# Patient Record
Sex: Female | Born: 1985 | Race: Black or African American | Hispanic: No | Marital: Married | Smoking: Never smoker
Health system: Southern US, Community
[De-identification: ages and names within clinical notes are randomized; demographics above are authoritative.]

## PROBLEM LIST (undated history)

## (undated) ENCOUNTER — Inpatient Hospital Stay (HOSPITAL_COMMUNITY): Payer: Self-pay

## (undated) ENCOUNTER — Ambulatory Visit: Payer: 59 | Source: Home / Self Care

## (undated) DIAGNOSIS — I471 Supraventricular tachycardia, unspecified: Secondary | ICD-10-CM

## (undated) DIAGNOSIS — IMO0002 Reserved for concepts with insufficient information to code with codable children: Secondary | ICD-10-CM

## (undated) DIAGNOSIS — D219 Benign neoplasm of connective and other soft tissue, unspecified: Secondary | ICD-10-CM

## (undated) DIAGNOSIS — R51 Headache: Secondary | ICD-10-CM

## (undated) HISTORY — DX: Headache: R51

## (undated) HISTORY — PX: HYSTEROSCOPY: SHX211

## (undated) HISTORY — PX: NO PAST SURGERIES: SHX2092

## (undated) HISTORY — DX: Reserved for concepts with insufficient information to code with codable children: IMO0002

## (undated) HISTORY — DX: Benign neoplasm of connective and other soft tissue, unspecified: D21.9

---
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
## 2003-12-04 ENCOUNTER — Emergency Department (HOSPITAL_COMMUNITY): Admission: EM | Admit: 2003-12-04 | Discharge: 2003-12-04 | Payer: Self-pay | Admitting: Emergency Medicine

## 2003-12-06 ENCOUNTER — Emergency Department (HOSPITAL_COMMUNITY): Admission: EM | Admit: 2003-12-06 | Discharge: 2003-12-06 | Payer: Self-pay | Admitting: Emergency Medicine

## 2004-09-24 ENCOUNTER — Inpatient Hospital Stay (HOSPITAL_COMMUNITY): Admission: AD | Admit: 2004-09-24 | Discharge: 2004-09-27 | Payer: Self-pay | Admitting: *Deleted

## 2005-07-19 ENCOUNTER — Emergency Department (HOSPITAL_COMMUNITY): Admission: EM | Admit: 2005-07-19 | Discharge: 2005-07-19 | Payer: Self-pay | Admitting: Emergency Medicine

## 2005-10-27 ENCOUNTER — Emergency Department (HOSPITAL_COMMUNITY): Admission: EM | Admit: 2005-10-27 | Discharge: 2005-10-28 | Payer: Self-pay | Admitting: Emergency Medicine

## 2005-11-21 ENCOUNTER — Ambulatory Visit (HOSPITAL_COMMUNITY): Admission: RE | Admit: 2005-11-21 | Discharge: 2005-11-21 | Payer: Self-pay | Admitting: Family Medicine

## 2005-12-05 ENCOUNTER — Ambulatory Visit (HOSPITAL_COMMUNITY): Admission: RE | Admit: 2005-12-05 | Discharge: 2005-12-05 | Payer: Self-pay | Admitting: Obstetrics & Gynecology

## 2006-02-15 ENCOUNTER — Inpatient Hospital Stay (HOSPITAL_COMMUNITY): Admission: AD | Admit: 2006-02-15 | Discharge: 2006-02-15 | Payer: Self-pay | Admitting: Gynecology

## 2006-02-24 ENCOUNTER — Ambulatory Visit: Payer: Self-pay | Admitting: Certified Nurse Midwife

## 2006-02-24 ENCOUNTER — Inpatient Hospital Stay (HOSPITAL_COMMUNITY): Admission: AD | Admit: 2006-02-24 | Discharge: 2006-02-24 | Payer: Self-pay | Admitting: Family Medicine

## 2006-05-05 ENCOUNTER — Ambulatory Visit: Payer: Self-pay | Admitting: *Deleted

## 2006-05-05 ENCOUNTER — Ambulatory Visit: Payer: Self-pay | Admitting: Gynecology

## 2006-05-05 ENCOUNTER — Inpatient Hospital Stay (HOSPITAL_COMMUNITY): Admission: AD | Admit: 2006-05-05 | Discharge: 2006-05-08 | Payer: Self-pay | Admitting: Family Medicine

## 2006-11-30 ENCOUNTER — Emergency Department (HOSPITAL_COMMUNITY): Admission: EM | Admit: 2006-11-30 | Discharge: 2006-11-30 | Payer: Self-pay | Admitting: Emergency Medicine

## 2008-04-13 ENCOUNTER — Ambulatory Visit (HOSPITAL_COMMUNITY): Admission: RE | Admit: 2008-04-13 | Discharge: 2008-04-13 | Payer: Self-pay | Admitting: Obstetrics

## 2008-05-20 LAB — CONVERTED CEMR LAB: Pap Smear: NORMAL

## 2010-04-18 ENCOUNTER — Ambulatory Visit: Payer: Self-pay | Admitting: Internal Medicine

## 2010-04-18 ENCOUNTER — Encounter: Payer: Self-pay | Admitting: Internal Medicine

## 2010-04-18 DIAGNOSIS — K219 Gastro-esophageal reflux disease without esophagitis: Secondary | ICD-10-CM | POA: Insufficient documentation

## 2010-04-18 DIAGNOSIS — R51 Headache: Secondary | ICD-10-CM

## 2010-04-18 DIAGNOSIS — R519 Headache, unspecified: Secondary | ICD-10-CM | POA: Insufficient documentation

## 2010-04-30 ENCOUNTER — Encounter: Payer: Self-pay | Admitting: Internal Medicine

## 2010-04-30 ENCOUNTER — Ambulatory Visit: Payer: Self-pay | Admitting: Internal Medicine

## 2010-04-30 DIAGNOSIS — I517 Cardiomegaly: Secondary | ICD-10-CM

## 2010-06-19 NOTE — Letter (Signed)
Summary: Results Follow-up Letter  [facility]  [address] [place], [place] [address]   Phone: [phone]  Fax: [phone]    04/18/2010  [address] [place], [place]  [address]  Dear Ms. Decoster,   The following are the results of your recent test(s):  Test     Result     Chest Xray     slightly enlarged heart  _________________________________________________________  Please call for an appointment soon _________________________________________________________ _________________________________________________________ _________________________________________________________  Sincerely,  Sanda Linger MD [facility]

## 2010-06-19 NOTE — Assessment & Plan Note (Signed)
Summary: new / bcbs / #/ cd   Vital Signs:  Patient profile:   25 year old female Menstrual status:  irregular LMP:     03/29/2010 Height:      67 inches Weight:      208 pounds BMI:     32.70 O2 Sat:      97 % on Room air Temp:     97.8 degrees F oral Pulse rate:   67 / minute Pulse rhythm:   regular Resp:     16 per minute BP sitting:   108 / 70  (left arm) Cuff size:   large  Vitals Entered By: Rock Nephew CMA (April 18, 2010 9:18 AM)  Nutrition Counseling: Patient's BMI is greater than 25 and therefore counseled on weight management options.  O2 Flow:  Room air CC: New pt c/o cough x 65mo w/ headache and R ear pain, Cough LMP (date): 03/29/2010     Menstrual Status irregular Enter LMP: 03/29/2010 Last PAP Result normal   Primary Care Provider:  Etta Grandchild MD  CC:  New pt c/o cough x 65mo w/ headache and R ear pain and Cough.  History of Present Illness:  Cough      This is a 25 year old woman who presents with Cough.  The symptoms began 1 month ago.  The intensity is described as moderate.  The patient reports non-productive cough and shortness of breath, but denies productive cough, pleuritic chest pain, wheezing, exertional dyspnea, fever, hemoptysis, and malaise.  The patient denies the following symptoms: cold/URI symptoms, sore throat, nasal congestion, chronic rhinitis, weight loss, acid reflux symptoms, and peripheral edema.  The cough is worse with cold exposure.  Ineffective prior treatments have included OTC cough medication.  She went to an [facility] one month ago and was given steroids, cheri-tussin, and Zpak and she did not get much benefit from these meds.  Preventive Screening-Counseling & Management  Alcohol-Tobacco     Alcohol drinks/day: <1     Alcohol type: all     >5/day in last 3 mos: no     Alcohol Counseling: not indicated; use of alcohol is not excessive or problematic     Feels need to cut down: no     Feels annoyed by complaints: no     Feels guilty re: drinking: no     Needs 'eye opener' in am: no     Smoking Status: never     Tobacco Counseling: not indicated; no tobacco use  Caffeine-Diet-Exercise     Does Patient Exercise: no  Hep-HIV-STD-Contraception     Hepatitis Risk: no risk noted     HIV Risk: no risk noted     STD Risk: no risk noted  Safety-Violence-Falls     Seat Belt Use: yes      Sexual History:  currently monogamous.        Drug Use:  no.        Blood Transfusions:  no.    Medications Prior to Update: 1)  None  Current Medications (verified): 1)  Symbicort 160-4.5 Mcg/act Aero (Budesonide-Formoterol Fumarate) .... 2 Puffs Two Times A Day 2)  Hydrocod Polst-Chlorphen Polst 10-8 Mg/51ml Lqcr (Hydrocod Polst-Chlorphen Polst) .... 5 Ml By Mouth Two Times A Day As Needed For Cough  Allergies (verified): No Known Drug Allergies  Past History:  Past Medical History: GERD Headache SVT  Past Surgical History: Denies surgical history  Family History: Family History of Alcoholism/Addiction Family History of Arthritis Family  History Ovarian cancer Family History Uterine cancer Family History of Stroke Family History of Emotion/Mental Illness Family History Hypertension Family History of Asthma  Social History: Occupation: Clinical biochemist Rep Married Never Smoked Alcohol use-yes Drug use-no Regular exercise-no Smoking Status:  never Drug Use:  no Does Patient Exercise:  no Education:  Environmental manager Use:  yes Hepatitis Risk:  no risk noted HIV Risk:  no risk noted STD Risk:  no risk noted Sexual History:  currently monogamous Blood Transfusions:  no  Review of Systems       The patient complains of weight gain and prolonged cough.  The patient denies anorexia, fever, weight loss, hoarseness, chest pain, syncope, dyspnea on exertion, peripheral edema, headaches, hemoptysis, abdominal pain, hematuria, suspicious skin lesions, enlarged lymph nodes, and angioedema.   CV:   Denies chest pain or discomfort, fainting, fatigue, leg cramps with exertion, lightheadness, near fainting, palpitations, shortness of breath with exertion, and swelling of feet. Resp:  Complains of cough and shortness of breath; denies chest discomfort, chest pain with inspiration, coughing up blood, excessive snoring, hypersomnolence, morning headaches, pleuritic, sputum productive, and wheezing.  Physical Exam  General:  alert, well-developed, well-nourished, well-hydrated, appropriate dress, normal appearance, healthy-appearing, cooperative to examination, good hygiene, and overweight-appearing.   Head:  normocephalic, atraumatic, no abnormalities observed, and no abnormalities palpated.   Eyes:  vision grossly intact, pupils equal, and no injection.   Ears:  R ear normal and L ear normal.   Nose:  no external deformity, no nasal discharge, no mucosal pallor, no mucosal edema, no airflow obstruction, no intranasal foreign body, no nasal polyps, no nasal mucosal lesions, no mucosal friability, no active bleeding or clots, no sinus percussion tenderness, and no septum abnormalities.   Mouth:  good dentition, pharynx pink and moist, no erythema, no exudates, no posterior lymphoid hypertrophy, no postnasal drip, no pharyngeal crowing, no lesions, no aphthous ulcers, no erosions, no tongue abnormalities, no leukoplakia, and no petechiae.   Neck:  supple, full ROM, no masses, no thyromegaly, no thyroid nodules or tenderness, no JVD, normal carotid upstroke, and no carotid bruits.   Lungs:  normal respiratory effort, no intercostal retractions, no accessory muscle use, normal breath sounds, no dullness, no fremitus, no crackles, and no wheezes.   Heart:  normal rate, regular rhythm, no murmur, no gallop, no rub, and no JVD.   Abdomen:  soft, non-tender, normal bowel sounds, no distention, no masses, no guarding, no rigidity, no rebound tenderness, no abdominal hernia, no inguinal hernia, no hepatomegaly,  and no splenomegaly.   Msk:  normal ROM, no joint tenderness, no joint swelling, no joint warmth, no redness over joints, no joint deformities, no joint instability, no crepitation, and no muscle atrophy.   Pulses:  R and L carotid,radial,femoral,dorsalis pedis and posterior tibial pulses are full and equal bilaterally Extremities:  No clubbing, cyanosis, edema, or deformity noted with normal full range of motion of all joints.   Neurologic:  No cranial nerve deficits noted. Station and gait are normal. Plantar reflexes are down-going bilaterally. DTRs are symmetrical throughout. Sensory, motor and coordinative functions appear intact. Skin:  turgor normal, color normal, no rashes, no suspicious lesions, no ecchymoses, no petechiae, no purpura, no ulcerations, and no edema.   Cervical Nodes:  no anterior cervical adenopathy and no posterior cervical adenopathy.   Axillary Nodes:  no R axillary adenopathy and no L axillary adenopathy.   Inguinal Nodes:  no R inguinal adenopathy and no L inguinal adenopathy.  Psych:  Cognition and judgment appear intact. Alert and cooperative with normal attention span and concentration. No apparent delusions, illusions, hallucinations   Impression & Recommendations:  Problem # 1:  COUGH (ICD-786.2) will look for pna, lesions, edema, etc Orders: T-2 View CXR (71020TC)  Problem # 2:  ASTHMA NOS W/ACUTE EXACERBATION (AVW-098.11) Assessment: New  Her updated medication list for this problem includes:    Symbicort 160-4.5 Mcg/act Aero (Budesonide-formoterol fumarate) .Marland Kitchen... 2 puffs two times a day  Complete Medication List: 1)  Symbicort 160-4.5 Mcg/act Aero (Budesonide-formoterol fumarate) .... 2 puffs two times a day 2)  Hydrocod Polst-chlorphen Polst 10-8 Mg/18ml Lqcr (Hydrocod polst-chlorphen polst) .... 5 ml by mouth two times a day as needed for cough  Other Orders: Admin 1st Vaccine (91478) Flu Vaccine 44yrs + (29562) Tdap => 62yrs IM (13086) Admin of  Any Addtl Vaccine (57846)  Patient Instructions: 1)  Please schedule a follow-up appointment in 1 month. 2)  It is important that you exercise regularly at least 20 minutes 5 times a week. If you develop chest pain, have severe difficulty breathing, or feel very tired , stop exercising immediately and seek medical attention. 3)  You need to lose weight. Consider a lower calorie diet and regular exercise.  4)  Get plenty of rest, drink lots of clear liquids, and use Tylenol or Ibuprofen for fever and comfort. Return in 7-10 days if you're not better:sooner if you're feeling worse. Prescriptions: HYDROCOD POLST-CHLORPHEN POLST 10-8 MG/5ML LQCR (HYDROCOD POLST-CHLORPHEN POLST) 5 ml by mouth two times a day as needed for cough  #4 ounces x 0   Entered and Authorized by:   Etta Grandchild MD   Signed by:   Etta Grandchild MD on 04/18/2010   Method used:   Print then Give to Patient   RxID:   [id number] SYMBICORT 160-4.5 MCG/ACT AERO (BUDESONIDE-FORMOTEROL FUMARATE) 2 puffs two times a day  #3 inhs x 0   Entered and Authorized by:   Etta Grandchild MD   Signed by:   Etta Grandchild MD on 04/18/2010   Method used:   Samples Given   RxID:   [id number]    Orders Added: 1)  T-2 View CXR [71020TC] 2)  Admin 1st Vaccine [90471] 3)  Flu Vaccine 3yrs + [95638] 4)  Tdap => 40yrs IM [90715] 5)  Admin of Any Addtl Vaccine [90472] 6)  New Patient Level IV [75643]   Immunizations Administered:  Tetanus Vaccine:    Vaccine Type: Tdap    Site: right deltoid    Mfr: GlaxoSmithKline    Dose: 0.5 ml    Route: IM    Given by: Rock Nephew CMA    Exp. Date: 03/08/2012    Lot #: [id number]    VIS given: 04/06/08 version given April 18, 2010.   Immunizations Administered:  Tetanus Vaccine:    Vaccine Type: Tdap    Site: right deltoid    Mfr: GlaxoSmithKline    Dose: 0.5 ml    Route: IM    Given by: Rock Nephew CMA    Exp. Date: 03/08/2012    Lot #: [id number]    VIS  given: 04/06/08 version given April 18, 2010.  Preventive Care Screening  Pap Smear:    Date:  05/20/2008    Results:  normal      .[email or web address] Flu Vaccine Consent Questions     Do you have a history of severe allergic reactions to this vaccine? no    Any  prior history of allergic reactions to egg and/or gelatin? no    Do you have a sensitivity to the preservative Thimersol? no    Do you have a past history of Guillan-Barre Syndrome? no    Do you currently have an acute febrile illness? no    Have you ever had a severe reaction to latex? no    Vaccine information given and explained to patient? yes    Are you currently pregnant? no    Lot Number:[id number]   Exp Date:11/17/2010   Site Given  Left Deltoid IM

## 2010-06-21 NOTE — Assessment & Plan Note (Signed)
Summary: FU  XRAY--PER PT  STC   Vital Signs:  Patient profile:   25 year old female Menstrual status:  irregular Height:      67 inches Weight:      210 pounds BMI:     33.01 O2 Sat:      97 % on Room air Temp:     97.7 degrees F oral Pulse rate:   80 / minute Pulse rhythm:   regular Resp:     16 per minute BP sitting:   120 / 62  (left arm) Cuff size:   large  Vitals Entered By: Bill Salinas CMA (April 30, 2010 8:36 AM)  O2 Flow:  Room air CC: follow-up visit/ ab   Primary Care Dazaria Macneill:  Etta Grandchild MD  CC:  follow-up visit/ ab.  History of Present Illness: She returns for f/up and to discuss her recent chest xray that showed mild cardiomegaly. All of her URI symptoms have resolved. She describes an episode of SVT in 2007 during a pregnancy and was evaluated by ? cardiologist but she says that the episode resolved and she has felt well since then.  Preventive Screening-Counseling & Management  Alcohol-Tobacco     Alcohol drinks/day: <1     Alcohol type: all     >5/day in last 3 mos: no     Alcohol Counseling: not indicated; use of alcohol is not excessive or problematic     Feels need to cut down: no     Feels annoyed by complaints: no     Feels guilty re: drinking: no     Needs 'eye opener' in am: no     Smoking Status: never     Tobacco Counseling: not indicated; no tobacco use  Hep-HIV-STD-Contraception     Hepatitis Risk: no risk noted     HIV Risk: no risk noted     STD Risk: no risk noted  Medications Prior to Update: 1)  Symbicort 160-4.5 Mcg/act Aero (Budesonide-Formoterol Fumarate) .... 2 Puffs Two Times A Day 2)  Hydrocod Polst-Chlorphen Polst 10-8 Mg/18ml Lqcr (Hydrocod Polst-Chlorphen Polst) .... 5 Ml By Mouth Two Times A Day As Needed For Cough  Current Medications (verified): 1)  Symbicort 160-4.5 Mcg/act Aero (Budesonide-Formoterol Fumarate) .... 2 Puffs Two Times A Day 2)  Hydrocod Polst-Chlorphen Polst 10-8 Mg/58ml Lqcr (Hydrocod  Polst-Chlorphen Polst) .... 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 Family History of Alcoholism/Addiction Family History of Arthritis Family History Ovarian cancer Family History Uterine cancer Family History of Stroke Family History of Emotion/Mental Illness Family History Hypertension Family History of Asthma  Social History: Reviewed history from 04/18/2010 and no changes required. 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   CV:  Denies bluish discoloration of lips or nails, chest pain or discomfort, difficulty breathing at night, difficulty breathing while lying down, fainting, fatigue, leg cramps with exertion, lightheadness, near fainting, palpitations, shortness of breath  with exertion, and swelling of feet.  Physical Exam  General:  alert, well-developed, well-nourished, well-hydrated, appropriate dress, normal appearance, healthy-appearing, cooperative to examination, good hygiene, and overweight-appearing.   Head:  normocephalic, atraumatic, no abnormalities observed, and no abnormalities palpated.   Mouth:  good dentition, pharynx pink and moist, no erythema, no exudates, no posterior lymphoid hypertrophy, no postnasal drip, no pharyngeal crowing, no lesions, no aphthous ulcers, no erosions, no tongue abnormalities, no leukoplakia, and no petechiae.   Neck:  supple, full ROM, no masses, no thyromegaly, no JVD, no carotid bruits, no cervical lymphadenopathy, and no neck tenderness.   Lungs:  normal respiratory effort, no intercostal retractions, no accessory muscle use, normal breath sounds, no dullness, no fremitus, no crackles, and no wheezes.   Heart:  normal rate, regular rhythm, no murmur, no gallop, no rub, and no JVD.   Abdomen:  soft, non-tender, normal bowel sounds, no distention, no masses, no guarding, no rigidity, no rebound tenderness, no abdominal hernia, no inguinal hernia, no hepatomegaly, and no splenomegaly.   Msk:  normal ROM, no joint tenderness, no joint swelling, no joint warmth, no redness over joints, no joint deformities, no joint instability, no crepitation, and no muscle atrophy.   Pulses:  R and L carotid,radial,femoral,dorsalis pedis and posterior tibial pulses are full and equal bilaterally Extremities:  No clubbing, cyanosis, edema, or deformity noted with normal full range of motion of all joints.   Neurologic:  No cranial nerve deficits noted. Station and gait are normal. Plantar reflexes are down-going bilaterally. DTRs are symmetrical throughout. Sensory, motor and coordinative functions appear intact. Skin:  turgor normal, color normal, no rashes, no suspicious lesions, no ecchymoses, no petechiae, no purpura, no ulcerations, and  no edema.   Cervical Nodes:  no anterior cervical adenopathy and no posterior cervical adenopathy.   Axillary Nodes:  no R axillary adenopathy and no L axillary adenopathy.   Psych:  Cognition and judgment appear intact. Alert and cooperative with normal attention span and concentration. No apparent delusions, illusions, hallucinations Additional Exam:  EKG is normal.   Impression & Recommendations:  Problem # 1:  CARDIOMEGALY, MILD (ICD-429.3) Assessment New  I think the chest xray finding is a manifestation of her body habitus and that she does not have any heart disease, she will follow symptoms for now and exercise and lose weight.  Orders: EKG w/ Interpretation (93000)  Complete Medication List: 1)  Symbicort 160-4.5 Mcg/act Aero (Budesonide-formoterol fumarate) .... 2 puffs two times a day 2)  Hydrocod Polst-chlorphen Polst 10-8 Mg/74ml Lqcr (Hydrocod polst-chlorphen polst) .... 5 ml by mouth two times a day as needed for cough  Patient Instructions: 1)  Please schedule a follow-up appointment in 2 months. 2)  It is important that you exercise regularly at least 20 minutes 5 times a week. If you develop chest pain, have severe difficulty breathing, or feel very tired , stop exercising immediately and seek medical attention. 3)  You need to lose weight. Consider a lower calorie diet and regular exercise.    Orders Added: 1)  Est. Patient Level IV [04540] 2)  EKG w/ Interpretation [93000]

## 2011-05-21 NOTE — L&D Delivery Note (Signed)
Delivery Note At 5:41 PM a viable female, "Brianna Brooks", was delivered via Vaginal, Spontaneous Delivery (Presentation: Right Occiput Anterior).  APGAR: 8, 9; weight 8 lb 0.8 oz (3650 g).   Placenta status: Intact, Spontaneous.  Cord: 3 vessels with the following complications: None.  Cord pH: NA.  Loose nuchal cord noted, reduced over vtx at delivery.  Anesthesia: None  Episiotomy: None Lacerations: None Suture Repair: None Est. Blood Loss (mL): 200 cc  Mom to postpartum.  Baby to skin to skin. Family plans inpatient circumcision  Nigel Bridgeman 05/05/2012, 7:06 PM

## 2011-06-21 DIAGNOSIS — IMO0002 Reserved for concepts with insufficient information to code with codable children: Secondary | ICD-10-CM

## 2011-06-21 DIAGNOSIS — R87619 Unspecified abnormal cytological findings in specimens from cervix uteri: Secondary | ICD-10-CM

## 2011-06-21 HISTORY — DX: Unspecified abnormal cytological findings in specimens from cervix uteri: R87.619

## 2011-06-21 HISTORY — DX: Reserved for concepts with insufficient information to code with codable children: IMO0002

## 2011-06-28 HISTORY — PX: HYSTEROSCOPY: SHX211

## 2011-07-16 ENCOUNTER — Encounter (INDEPENDENT_AMBULATORY_CARE_PROVIDER_SITE_OTHER): Payer: 59 | Admitting: Obstetrics and Gynecology

## 2011-07-16 DIAGNOSIS — Z01419 Encounter for gynecological examination (general) (routine) without abnormal findings: Secondary | ICD-10-CM

## 2011-07-16 DIAGNOSIS — B373 Candidiasis of vulva and vagina: Secondary | ICD-10-CM

## 2011-08-02 ENCOUNTER — Encounter: Payer: 59 | Admitting: Obstetrics and Gynecology

## 2011-09-06 ENCOUNTER — Inpatient Hospital Stay (HOSPITAL_COMMUNITY)
Admission: AD | Admit: 2011-09-06 | Discharge: 2011-09-07 | Disposition: A | Discharge status: Home / Self Care | Payer: 59 | Source: Ambulatory Visit | Attending: Obstetrics and Gynecology | Admitting: Obstetrics and Gynecology

## 2011-09-06 DIAGNOSIS — O26859 Spotting complicating pregnancy, unspecified trimester: Secondary | ICD-10-CM | POA: Insufficient documentation

## 2011-09-06 DIAGNOSIS — O26851 Spotting complicating pregnancy, first trimester: Secondary | ICD-10-CM

## 2011-09-06 HISTORY — DX: Supraventricular tachycardia, unspecified: I47.10

## 2011-09-06 HISTORY — DX: Supraventricular tachycardia: I47.1

## 2011-09-07 ENCOUNTER — Inpatient Hospital Stay (HOSPITAL_COMMUNITY): Payer: 59

## 2011-09-07 ENCOUNTER — Encounter (HOSPITAL_COMMUNITY): Payer: Self-pay | Admitting: *Deleted

## 2011-09-07 DIAGNOSIS — O200 Threatened abortion: Secondary | ICD-10-CM

## 2011-09-07 LAB — CBC
Hemoglobin: 12.7 g/dL (ref 12.0–15.0)
MCH: 26.4 pg (ref 26.0–34.0)
MCV: 79 fL (ref 78.0–100.0)
RBC: 4.81 MIL/uL (ref 3.87–5.11)

## 2011-09-07 LAB — WET PREP, GENITAL
Clue Cells Wet Prep HPF POC: NONE SEEN
Trich, Wet Prep: NONE SEEN

## 2011-09-07 LAB — ABO/RH: ABO/RH(D): B POS

## 2011-09-07 NOTE — MAU Note (Signed)
Pt had IUD removed on February 7, Pt reports LMP march 10th. Vaginal discharge started 4/19.

## 2011-09-07 NOTE — MAU Provider Note (Signed)
Brianna Brooks 17 y.o.G3P2002 @[redacted]w[redacted]d  by LMP Chief Complaint  Patient presents with  . Vaginal Discharge   First Provider Initiated Contact with Patient 09/07/11 0046    SUBJECTIVE  HPI: HPI: Brianna Brooks is a 26 y.o. year old G21P2002 female at [redacted]w[redacted]d weeks gestation who presents to MAU reporting spotting since yesterday. She denies abd pain or passage tissue. Last IC 2 days ago. She has not had any ultrasounds or lab work this pregnancy. She states she is scheduled to start [facility] at [facility] May 1st.   Past Medical History  Diagnosis Date  . SVT (supraventricular tachycardia)    Past Surgical History  Procedure Date  . No past surgeries    History   Social History  . Marital Status: Married    Spouse Name: N/A    Number of Children: N/A  . Years of Education: N/A   Occupational History  . Not on file.   Social History Main Topics  . Smoking status: Never Smoker   . Smokeless tobacco: Not on file  . Alcohol Use: No  . Drug Use: No  . Sexually Active: Yes   Other Topics Concern  . Not on file   Social History Narrative  . No narrative on file   No current facility-administered medications on file as of 09/07/2011.   Medications Prior to Admission  Medication Sig Dispense Refill  . Prenatal Vit-Fe Fumarate-FA (PRENATAL MULTIVITAMIN) TABS Take 1 tablet by mouth daily.       ROS: Pertinent items in HPI  OBJECTIVE Blood pressure 119/76, pulse 102, temperature 97.6 F (36.4 C), temperature source Oral, resp. rate 18, height 5\' 6"  (1.676 m), weight 99.156 kg (218 lb 9.6 oz), last menstrual period 07/28/2011. GENERAL: Well-developed, well-nourished female in no acute distress.  ABDOMEN: Soft, nontender EXTREMITIES: Nontender, no edema SPECULUM EXAM: NEFG, small amount of pink-tinged, odorless discharge, cervix friable BIMANUAL: cervix closed; no CMT or adnexal tenderness or masses. Uterus not enlarged.   LAB RESULTS Results for orders placed during the hospital encounter  of 09/06/11 (from the past 24 hour(s))  POCT PREGNANCY, URINE     Status: Abnormal   Collection Time   09/07/11 12:07 AM      Component Value Range   Preg Test, Ur POSITIVE (*) NEGATIVE   CBC     Status: Normal   Collection Time   09/07/11 12:31 AM      Component Value Range   WBC 8.2  4.0 - 10.5 (K/uL)   RBC 4.81  3.87 - 5.11 (MIL/uL)   Hemoglobin 12.7  12.0 - 15.0 (g/dL)   HCT 14.7  82.9 - 56.2 (%)   MCV 79.0  78.0 - 100.0 (fL)   MCH 26.4  26.0 - 34.0 (pg)   MCHC 33.4  30.0 - 36.0 (g/dL)   RDW 13.0  86.5 - 78.4 (%)   Platelets 264  150 - 400 (K/uL)  ABO/RH     Status: Normal   Collection Time   09/07/11 12:32 AM      Component Value Range   ABO/RH(D) B POS    HCG, QUANTITATIVE, PREGNANCY     Status: Abnormal   Collection Time   09/07/11 12:32 AM      Component Value Range   hCG, Beta Chain, Quant, S 13258 (*) <5 (mIU/mL)  WET PREP, GENITAL     Status: Abnormal   Collection Time   09/07/11  1:36 AM      Component Value Range   Yeast  Wet Prep HPF POC NONE SEEN  NONE SEEN    Trich, Wet Prep NONE SEEN  NONE SEEN    Clue Cells Wet Prep HPF POC NONE SEEN  NONE SEEN    WBC, Wet Prep HPF POC MANY (*) NONE SEEN    IMAGING US Ob Comp Less 14 Wks  09/07/2011  *RADIOLOGY REPORT*  Clinical Data: Discharge and spotting.  Beta HCG is pending. Estimated gestational age by LMP is 5 weeks 6 days.  OBSTETRIC <14 WK [place] AND TRANSVAGINAL OB US  Technique:  Both transabdominal and transvaginal ultrasound examinations were performed for complete evaluation of the gestation as well as the maternal uterus, adnexal regions, and pelvic cul-de-sac.  Transvaginal technique was performed to assess early pregnancy.  Comparison:  None.  Intrauterine gestational sac:  A single intrauterine pregnancy is visualized.  No subchorionic hemorrhage. Yolk sac: Present Embryo: Present Cardiac Activity: Demonstrated Heart Rate: 101 bpm  CRL: 2.7   mm  5   w  6   d         [place] EDC: 05/03/2012  Maternal uterus/adnexae: The  uterus is anteverted with under distended bladder.  No myometrial masses.  The right ovary measures 3.7 x 2.5 x 2.3 centimeters and contains a corpus luteum cyst.  The left ovary measures 2.5 x 1.6 x 1.7 cm.  No abnormal adnexal mass lesions.  No free pelvic fluid collections.  IMPRESSION: Single intrauterine pregnancy.  Estimated gestational age by crown- rump length is 5 weeks 6 days.  Original Report Authenticated By: Marlon Pel, M.D.   US Ob Transvaginal  09/07/2011  *RADIOLOGY REPORT*  Clinical Data: Discharge and spotting.  Beta HCG is pending. Estimated gestational age by LMP is 5 weeks 6 days.  OBSTETRIC <14 WK [place] AND TRANSVAGINAL OB US  Technique:  Both transabdominal and transvaginal ultrasound examinations were performed for complete evaluation of the gestation as well as the maternal uterus, adnexal regions, and pelvic cul-de-sac.  Transvaginal technique was performed to assess early pregnancy.  Comparison:  None.  Intrauterine gestational sac:  A single intrauterine pregnancy is visualized.  No subchorionic hemorrhage. Yolk sac: Present Embryo: Present Cardiac Activity: Demonstrated Heart Rate: 101 bpm  CRL: 2.7   mm  5   w  6   d         [place] EDC: 05/03/2012  Maternal uterus/adnexae: The uterus is anteverted with under distended bladder.  No myometrial masses.  The right ovary measures 3.7 x 2.5 x 2.3 centimeters and contains a corpus luteum cyst.  The left ovary measures 2.5 x 1.6 x 1.7 cm.  No abnormal adnexal mass lesions.  No free pelvic fluid collections.  IMPRESSION: Single intrauterine pregnancy.  Estimated gestational age by crown- rump length is 5 weeks 6 days.  Original Report Authenticated By: Marlon Pel, M.D.    ASSESSMENT 1.  5.6 week SIUP  2. Spotting in first trimester     PLAN  Medication List  As of 09/07/2011  2:33 AM   CONTINUE taking these medications         prenatal multivitamin Tabs            Follow-up Information    Follow up with [facility] on 09/18/2011. (or MAU as needed if symptoms worsen)    Contact information:   [address] [place] [address]        Bleeding precautions Pelvic rest x 1 week  Dorathy Kinsman 09/07/2011 12:57  AM

## 2011-09-09 LAB — GC/CHLAMYDIA PROBE AMP, GENITAL: Chlamydia, DNA Probe: NEGATIVE

## 2011-09-12 ENCOUNTER — Telehealth: Payer: Self-pay | Admitting: Obstetrics and Gynecology

## 2011-09-12 NOTE — Telephone Encounter (Signed)
Routed to nancy °

## 2011-09-12 NOTE — Telephone Encounter (Signed)
TC from pt.   States is having red bleeding with wiping.   Had cramping this AM.  States was seen 09/06/11 at [facility] for brown D/C which contnued until 09/09/11.  None since.   Denies intercourse.  Consult with [facility] who will contact pt.

## 2011-09-18 ENCOUNTER — Ambulatory Visit (INDEPENDENT_AMBULATORY_CARE_PROVIDER_SITE_OTHER): Payer: 59 | Admitting: Obstetrics and Gynecology

## 2011-09-18 DIAGNOSIS — Z3201 Encounter for pregnancy test, result positive: Secondary | ICD-10-CM

## 2011-09-18 DIAGNOSIS — Z331 Pregnant state, incidental: Secondary | ICD-10-CM

## 2011-09-18 LAB — POCT URINALYSIS DIPSTICK
Bilirubin, UA: NEGATIVE
Ketones, UA: NEGATIVE
Leukocytes, UA: NEGATIVE
Nitrite, UA: NEGATIVE

## 2011-09-19 ENCOUNTER — Encounter: Payer: Self-pay | Admitting: Obstetrics and Gynecology

## 2011-09-19 LAB — PRENATAL PANEL VII
HIV: NONREACTIVE
Hemoglobin: 13.1 g/dL (ref 12.0–15.0)
Hepatitis B Surface Ag: NEGATIVE
Lymphocytes Relative: 29 % (ref 12–46)
Lymphs Abs: 2.1 10*3/uL (ref 0.7–4.0)
MCH: 27.8 pg (ref 26.0–34.0)
Monocytes Relative: 8 % (ref 3–12)
Neutro Abs: 4.6 10*3/uL (ref 1.7–7.7)
Neutrophils Relative %: 62 % (ref 43–77)
RBC: 4.71 MIL/uL (ref 3.87–5.11)
Rubella: 27.9 IU/mL — ABNORMAL HIGH
WBC: 7.4 10*3/uL (ref 4.0–10.5)

## 2011-09-20 LAB — HEMOGLOBINOPATHY EVALUATION: Hgb S Quant: 34.8 % — ABNORMAL HIGH

## 2011-09-20 LAB — CULTURE, OB URINE

## 2011-09-22 ENCOUNTER — Encounter: Payer: Self-pay | Admitting: Obstetrics and Gynecology

## 2011-09-22 DIAGNOSIS — D573 Sickle-cell trait: Secondary | ICD-10-CM | POA: Insufficient documentation

## 2011-10-07 ENCOUNTER — Encounter: Payer: Self-pay | Admitting: Obstetrics and Gynecology

## 2011-10-08 ENCOUNTER — Encounter: Payer: 59 | Admitting: Obstetrics and Gynecology

## 2011-10-16 ENCOUNTER — Ambulatory Visit: Payer: 59 | Admitting: Obstetrics and Gynecology

## 2011-11-06 ENCOUNTER — Ambulatory Visit (INDEPENDENT_AMBULATORY_CARE_PROVIDER_SITE_OTHER): Payer: 59 | Admitting: Obstetrics and Gynecology

## 2011-11-06 ENCOUNTER — Encounter: Payer: Self-pay | Admitting: Obstetrics and Gynecology

## 2011-11-06 VITALS — BP 120/62 | Wt 220.0 lb

## 2011-11-06 DIAGNOSIS — J45901 Unspecified asthma with (acute) exacerbation: Secondary | ICD-10-CM

## 2011-11-06 DIAGNOSIS — Z331 Pregnant state, incidental: Secondary | ICD-10-CM

## 2011-11-06 LAB — POCT URINALYSIS DIPSTICK
Bilirubin, UA: NEGATIVE
Glucose, UA: NEGATIVE
Nitrite, UA: NEGATIVE
Urobilinogen, UA: NEGATIVE
pH, UA: 5

## 2011-11-06 MED ORDER — CONCEPT DHA 53.5-38-1 MG PO CAPS: 1.0000 | ORAL_CAPSULE | Freq: Every day | ORAL | Status: DC

## 2011-11-06 NOTE — Progress Notes (Signed)
Last Pap 07/16/2011" LSIL"pt did not have Colpo done Pt states she has no concerns or complaints. Pt need Rx for PNV's.

## 2011-11-07 ENCOUNTER — Encounter: Payer: Self-pay | Admitting: Obstetrics and Gynecology

## 2011-11-07 LAB — CULTURE, OB URINE: Organism ID, Bacteria: NO GROWTH

## 2011-11-07 NOTE — Progress Notes (Signed)
Patient ID: Brianna Brooks, female   DOB: Nov 21, 1985, 26 y.o.   MRN: [id number] Brianna Brooks is a 26 y.o. female presenting for new ob visit. Certain of LMP will use for dating. Taking PNV. @MED  @IPILAPH @ OB History    Grav Para Term Preterm Abortions TAB SAB Ect Mult Living   3 2 2       2     uncomplicated pregnancies, labor, and deliveries Past Medical History  Diagnosis Date  . SVT (supraventricular tachycardia)   . Abnormal Pap smear 06/2011    WAS TO HAVE COLPO;HAS NOT HAD YET, HAD + UPT  . Infection     UTI NOT FREQUENT  . Headache     MIGRAINES;USUALLY SLEEPS  . GERD (gastroesophageal reflux disease)     NOT FREQUENT, HAS MORE WITH PREGNANCY  . Fibroid    Past Surgical History  Procedure Date  . No past surgeries   . Hysteroscopy 06/28/2011    IN OFFICE IUD REMOVAL WITH [facility]  . Hysteroscopy    Family History: family history includes Asthma in her brother, maternal aunt, mother, and sister; Cancer in her maternal aunts and paternal grandfather; Cholelithiasis in her mother; Heart attack in her maternal grandmother; Heart disease in her maternal aunt; Hypertension in her maternal aunt, maternal grandmother, and maternal uncle; Hyperthyroidism in her paternal grandmother; and Migraines in her mother.  There is no history of Anesthesia problems. Social History:  reports that she has never smoked. She has never used smokeless tobacco. She reports that she does not drink alcohol or use illicit drugs.  @ROS @    Blood pressure 120/62, weight 220 lb (99.791 kg), last menstrual period 07/28/2011. Physical exam: Calm, no distress, HEENT wnl lungs clear bilaterally, AP RRR, breasts bilaterally no masses, dimpling, or drainage, abd soft, gravid, nt, bowel sounds active, abdomen nontender, Fundal height 14 Normal hair distrubition mons pubis,  EGBUS WNL, sterile speculum exam,  vagina pink, moist normal rugae,  cerix LTC, no cervical motion tenderness, No adnexal masses or  tenderness Scant white discharge pelvis adequate uterus firm 14 weeks size DTR + 1  no clonus No edema to lower extremities  Prenatal labs: ABO, Rh: B/POS/-- (05/01 1017) Antibody: NEG (05/01 1017) Rubella:   RPR: NON REAC (05/01 1017)  HBsAg: NEGATIVE (05/01 1017)  HIV: NON REACTIVE (05/01 1017)  GBS:     Assessment/Plan: 14 IUP GC/CHL WET PREP  ULTRASOUND at 20 weeks discussed. 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 Collaboration with Dr. Stefano Gaul. [facility], Brianna Brooks 11/07/2011, 7:36 AM Lavera Guise, CNM

## 2011-11-07 NOTE — Progress Notes (Signed)
Patient ID: Brianna Brooks, female   DOB: 1986-03-14, 26 y.o.   MRN: [id number] 09/07/2011 GC/CHL neg, neg and wet prep neg at [facility]. Lavera Guise, CNM

## 2011-12-04 ENCOUNTER — Encounter: Payer: 59 | Admitting: Obstetrics and Gynecology

## 2011-12-09 ENCOUNTER — Telehealth: Payer: Self-pay | Admitting: Obstetrics and Gynecology

## 2011-12-09 NOTE — Telephone Encounter (Signed)
Triage/ob °

## 2011-12-23 ENCOUNTER — Encounter: Payer: Self-pay | Admitting: Obstetrics and Gynecology

## 2011-12-23 ENCOUNTER — Ambulatory Visit (INDEPENDENT_AMBULATORY_CARE_PROVIDER_SITE_OTHER): Payer: 59 | Admitting: Obstetrics and Gynecology

## 2011-12-23 VITALS — BP 110/66 | Wt 220.0 lb

## 2011-12-23 DIAGNOSIS — Z331 Pregnant state, incidental: Secondary | ICD-10-CM

## 2011-12-23 DIAGNOSIS — Z3490 Encounter for supervision of normal pregnancy, unspecified, unspecified trimester: Secondary | ICD-10-CM

## 2011-12-23 NOTE — Progress Notes (Signed)
[redacted]w[redacted]d No complaints, no change in vaginal secretions. To have Anatomy scan. To schedule today for this week. Samples of PNV given. ROb this week

## 2011-12-23 NOTE — Progress Notes (Signed)
Reqs quick visit scheduled for work in 30 mins

## 2011-12-30 ENCOUNTER — Ambulatory Visit (INDEPENDENT_AMBULATORY_CARE_PROVIDER_SITE_OTHER): Payer: 59

## 2011-12-30 ENCOUNTER — Encounter: Payer: Self-pay | Admitting: Obstetrics and Gynecology

## 2011-12-30 ENCOUNTER — Other Ambulatory Visit: Payer: Self-pay | Admitting: Obstetrics and Gynecology

## 2011-12-30 ENCOUNTER — Ambulatory Visit (INDEPENDENT_AMBULATORY_CARE_PROVIDER_SITE_OTHER): Payer: 59 | Admitting: Obstetrics and Gynecology

## 2011-12-30 VITALS — BP 150/64 | Wt 220.0 lb

## 2011-12-30 DIAGNOSIS — IMO0002 Reserved for concepts with insufficient information to code with codable children: Secondary | ICD-10-CM

## 2011-12-30 DIAGNOSIS — Z3689 Encounter for other specified antenatal screening: Secondary | ICD-10-CM

## 2011-12-30 DIAGNOSIS — R87612 Low grade squamous intraepithelial lesion on cytologic smear of cervix (LGSIL): Secondary | ICD-10-CM

## 2011-12-30 LAB — US OB COMP + 14 WK

## 2011-12-30 NOTE — Progress Notes (Signed)
NO COMPLAINTS 

## 2011-12-30 NOTE — Patient Instructions (Signed)
Preventing Preterm Labor Preterm labor is when a pregnant woman has contractions that cause the cervix to open, shorten, and thin before 37 weeks of pregnancy. You will have regular contractions (tightening) 2 to 3 minutes apart. This usually causes discomfort or pain. HOME CARE  Eat a healthy diet.   Take your vitamins as told by your doctor.   Drink enough fluids to keep your pee (urine) clear or pale yellow every day.   Get rest and sleep.   Do not have sex if you are at high risk for preterm labor.   Follow your doctor's advice about activity, medicines, and tests.   Avoid stress.   Avoid hard labor or exercise that lasts for a long time.   Do not smoke.  GET HELP RIGHT AWAY IF:   You are having contractions.   You have belly (abdominal) pain.   You have bleeding from your vagina.   You have pain when you pee (urinate).   You have abnormal discharge from your vagina.   You have a temperature by mouth above 102 F (38.9 C).  MAKE SURE YOU:  Understand these instructions.   Will watch your condition.   Will get help if you are not doing well or get worse.  Document Released: 08/02/2008 Document Revised: 04/25/2011 Document Reviewed: 08/02/2008 Exit[employer] Patient Information 2012 [employer]. 

## 2011-12-30 NOTE — Progress Notes (Signed)
[redacted]w[redacted]d Requests MD delivery, rv'd CNM would be involved Plans no meds,  rv'd anat US WNL S=D, AUA 12/8,  EFW 1#4oz = 89.5% Cx=3.31cm vtx position Posterior placenta  Normal fluid All anatomy WNL, nl ovaries and adnexa  F/u 4wks for 1hr gtt,  send UA for cx NV, (+SCT, need to rec FOB testing)  rv'd sx's PTL and diet  Needs colpo, will check w MD for when to schedule this

## 2011-12-31 ENCOUNTER — Telehealth: Payer: Self-pay | Admitting: Obstetrics and Gynecology

## 2011-12-31 NOTE — Telephone Encounter (Signed)
Message copied by Delon Sacramento on Tue Dec 31, 2011 11:49 AM ------      Message from: Malissa Hippo.      Created: Mon Dec 30, 2011  2:35 PM      Regarding: pt is 22wks, needs 1st available colposocopy        Please sched ASAP       Pt had LSIL on pap in March            Thanks      SL

## 2011-12-31 NOTE — Telephone Encounter (Signed)
Tc to pt regarding msg below, lm on vm to call back 

## 2011-12-31 NOTE — Telephone Encounter (Signed)
Pt called back regarding msg below.  Pt voices understanding.  Appt for Colpo sched for Tuesday 01/24/12 w/ SR @ 0830.  Colpo instructions given to pt.

## 2012-01-24 ENCOUNTER — Encounter: Payer: 59 | Admitting: Obstetrics and Gynecology

## 2012-01-27 ENCOUNTER — Encounter: Payer: 59 | Admitting: Obstetrics and Gynecology

## 2012-01-30 ENCOUNTER — Encounter: Payer: Self-pay | Admitting: Obstetrics and Gynecology

## 2012-01-30 ENCOUNTER — Ambulatory Visit (INDEPENDENT_AMBULATORY_CARE_PROVIDER_SITE_OTHER): Payer: 59 | Admitting: Obstetrics and Gynecology

## 2012-01-30 VITALS — BP 106/60 | Wt 222.0 lb

## 2012-01-30 DIAGNOSIS — D573 Sickle-cell trait: Secondary | ICD-10-CM

## 2012-01-30 DIAGNOSIS — Z331 Pregnant state, incidental: Secondary | ICD-10-CM

## 2012-01-30 LAB — HEMOGLOBIN: Hemoglobin: 11.6 g/dL — ABNORMAL LOW (ref 12.0–15.0)

## 2012-01-30 LAB — POCT URINALYSIS DIPSTICK
Blood, UA: NEGATIVE
Glucose, UA: NEGATIVE
Nitrite, UA: NEGATIVE
Protein, UA: NEGATIVE
Spec Grav, UA: 1.015
Urobilinogen, UA: NEGATIVE

## 2012-01-30 NOTE — Progress Notes (Signed)
[redacted]w[redacted]d Order sent to [facility] today to get FOB tested for sickle cell per SL

## 2012-01-30 NOTE — Progress Notes (Signed)
[redacted]w[redacted]d Chem 9, normal Plans BF, [facility] rv'd FKC and labor precautions Requests MD delivery  Pt needs colpo, will review w MD for timing, pt states she doesn't want to do during pregnancy rv'd PTL and FKC RTO 2wks

## 2012-01-30 NOTE — Progress Notes (Signed)
Pt states she has no concerns today.  1 GTT today.  Draw @ 10:30am.

## 2012-01-30 NOTE — Patient Instructions (Signed)
Preventing Preterm Labor Preterm labor is when a pregnant woman has contractions that cause the cervix to open, shorten, and thin before 37 weeks of pregnancy. You will have regular contractions (tightening) 2 to 3 minutes apart. This usually causes discomfort or pain. HOME CARE  Eat a healthy diet.   Take your vitamins as told by your doctor.   Drink enough fluids to keep your pee (urine) clear or pale yellow every day.   Get rest and sleep.   Do not have sex if you are at high risk for preterm labor.   Follow your doctor's advice about activity, medicines, and tests.   Avoid stress.   Avoid hard labor or exercise that lasts for a long time.   Do not smoke.  GET HELP RIGHT AWAY IF:   You are having contractions.   You have belly (abdominal) pain.   You have bleeding from your vagina.   You have pain when you pee (urinate).   You have abnormal discharge from your vagina.   You have a temperature by mouth above 102 F (38.9 C).  MAKE SURE YOU:  Understand these instructions.   Will watch your condition.   Will get help if you are not doing well or get worse.  Document Released: 08/02/2008 Document Revised: 04/25/2011 Document Reviewed: 08/02/2008 ExitCare Patient Information 2012 [employer].Fetal Movement Counts Patient Name: __________________________________________________ Patient Due Date: ____________________ Kick counts is highly recommended in high risk pregnancies, but it is a good idea for every pregnant woman to do. Start counting fetal movements at 28 weeks of the pregnancy. Fetal movements increase after eating a full meal or eating or drinking something sweet (the blood sugar is higher). It is also important to drink plenty of fluids (well hydrated) before doing the count. Lie on your left side because it helps with the circulation or you can sit in a comfortable chair with your arms over your belly (abdomen) with no distractions around you. DOING THE  COUNT  Try to do the count the same time of day each time you do it.   Mark the day and time, then see how long it takes for you to feel 10 movements (kicks, flutters, swishes, rolls). You should have at least 10 movements within 2 hours. You will most likely feel 10 movements in much less than 2 hours. If you do not, wait an hour and count again. After a couple of days you will see a pattern.   What you are looking for is a change in the pattern or not enough counts in 2 hours. Is it taking longer in time to reach 10 movements?  SEEK MEDICAL CARE IF:  You feel less than 10 counts in 2 hours. Tried twice.   No movement in one hour.   The pattern is changing or taking longer each day to reach 10 counts in 2 hours.   You feel the baby is not moving as it usually does.  Date: ____________ Movements: ____________ Start time: ____________ Finish time: ____________  Date: ____________ Movements: ____________ Start time: ____________ Finish time: ____________ Date: ____________ Movements: ____________ Start time: ____________ Finish time: ____________ Date: ____________ Movements: ____________ Start time: ____________ Finish time: ____________ Date: ____________ Movements: ____________ Start time: ____________ Finish time: ____________ Date: ____________ Movements: ____________ Start time: ____________ Finish time: ____________ Date: ____________ Movements: ____________ Start time: ____________ Finish time: ____________ Date: ____________ Movements: ____________ Start time: ____________ Finish time: ____________  Date: ____________ Movements: ____________ Start time: ____________ Finish time:   ____________ Date: ____________ Movements: ____________ Start time: ____________ Finish time: ____________ Date: ____________ Movements: ____________ Start time: ____________ Finish time: ____________ Date: ____________ Movements: ____________ Start time: ____________ Finish time: ____________ Date:  ____________ Movements: ____________ Start time: ____________ Finish time: ____________ Date: ____________ Movements: ____________ Start time: ____________ Finish time: ____________ Date: ____________ Movements: ____________ Start time: ____________ Finish time: ____________  Date: ____________ Movements: ____________ Start time: ____________ Finish time: ____________ Date: ____________ Movements: ____________ Start time: ____________ Finish time: ____________ Date: ____________ Movements: ____________ Start time: ____________ Finish time: ____________ Date: ____________ Movements: ____________ Start time: ____________ Finish time: ____________ Date: ____________ Movements: ____________ Start time: ____________ Finish time: ____________ Date: ____________ Movements: ____________ Start time: ____________ Finish time: ____________ Date: ____________ Movements: ____________ Start time: ____________ Finish time: ____________  Date: ____________ Movements: ____________ Start time: ____________ Finish time: ____________ Date: ____________ Movements: ____________ Start time: ____________ Finish time: ____________ Date: ____________ Movements: ____________ Start time: ____________ Finish time: ____________ Date: ____________ Movements: ____________ Start time: ____________ Finish time: ____________ Date: ____________ Movements: ____________ Start time: ____________ Finish time: ____________ Date: ____________ Movements: ____________ Start time: ____________ Finish time: ____________ Date: ____________ Movements: ____________ Start time: ____________ Finish time: ____________  Date: ____________ Movements: ____________ Start time: ____________ Finish time: ____________ Date: ____________ Movements: ____________ Start time: ____________ Finish time: ____________ Date: ____________ Movements: ____________ Start time: ____________ Finish time: ____________ Date: ____________ Movements: ____________ Start  time: ____________ Finish time: ____________ Date: ____________ Movements: ____________ Start time: ____________ Finish time: ____________ Date: ____________ Movements: ____________ Start time: ____________ Finish time: ____________ Date: ____________ Movements: ____________ Start time: ____________ Finish time: ____________  Date: ____________ Movements: ____________ Start time: ____________ Finish time: ____________ Date: ____________ Movements: ____________ Start time: ____________ Finish time: ____________ Date: ____________ Movements: ____________ Start time: ____________ Finish time: ____________ Date: ____________ Movements: ____________ Start time: ____________ Finish time: ____________ Date: ____________ Movements: ____________ Start time: ____________ Finish time: ____________ Date: ____________ Movements: ____________ Start time: ____________ Finish time: ____________ Date: ____________ Movements: ____________ Start time: ____________ Finish time: ____________  Date: ____________ Movements: ____________ Start time: ____________ Finish time: ____________ Date: ____________ Movements: ____________ Start time: ____________ Finish time: ____________ Date: ____________ Movements: ____________ Start time: ____________ Finish time: ____________ Date: ____________ Movements: ____________ Start time: ____________ Finish time: ____________ Date: ____________ Movements: ____________ Start time: ____________ Finish time: ____________ Date: ____________ Movements: ____________ Start time: ____________ Finish time: ____________ Date: ____________ Movements: ____________ Start time: ____________ Finish time: ____________  Date: ____________ Movements: ____________ Start time: ____________ Finish time: ____________ Date: ____________ Movements: ____________ Start time: ____________ Finish time: ____________ Date: ____________ Movements: ____________ Start time: ____________ Finish time:  ____________ Date: ____________ Movements: ____________ Start time: ____________ Finish time: ____________ Date: ____________ Movements: ____________ Start time: ____________ Finish time: ____________ Date: ____________ Movements: ____________ Start time: ____________ Finish time: ____________ Document Released: 06/05/2006 Document Revised: 04/25/2011 Document Reviewed: 12/06/2008 Exit[employer] Patient Information 2012 [employer]. 

## 2012-01-31 LAB — RPR

## 2012-01-31 LAB — GLUCOSE TOLERANCE, 1 HOUR (50G) W/O FASTING: Glucose, 1 Hour GTT: 111 mg/dL (ref 70–140)

## 2012-02-01 LAB — URINE CULTURE

## 2012-02-03 ENCOUNTER — Telehealth: Payer: Self-pay | Admitting: Obstetrics and Gynecology

## 2012-02-03 NOTE — Telephone Encounter (Signed)
TC to pt.  States is aware of recommendation for colpo. However, her insurance will not cover and is already making payments for delivery and cannot afford to have procedure at this time.  SL to be made aware.

## 2012-02-03 NOTE — Telephone Encounter (Signed)
Message copied by Mason Jim on Mon Feb 03, 2012 10:09 AM ------      Message from: Malissa Hippo      Created: Sat Feb 01, 2012 12:42 PM      Regarding: pt needs colpo ASAP       Please call to schedule colpo ASAP      Hx LSIL in Feb

## 2012-02-06 ENCOUNTER — Inpatient Hospital Stay (HOSPITAL_COMMUNITY)
Admission: AD | Admit: 2012-02-06 | Discharge: 2012-02-06 | Disposition: A | Discharge status: Home / Self Care | Payer: 59 | Source: Ambulatory Visit | Attending: Obstetrics and Gynecology | Admitting: Obstetrics and Gynecology

## 2012-02-06 ENCOUNTER — Telehealth: Payer: Self-pay | Admitting: Obstetrics and Gynecology

## 2012-02-06 ENCOUNTER — Encounter (HOSPITAL_COMMUNITY): Payer: Self-pay | Admitting: *Deleted

## 2012-02-06 DIAGNOSIS — R87612 Low grade squamous intraepithelial lesion on cytologic smear of cervix (LGSIL): Secondary | ICD-10-CM

## 2012-02-06 DIAGNOSIS — IMO0002 Reserved for concepts with insufficient information to code with codable children: Secondary | ICD-10-CM

## 2012-02-06 DIAGNOSIS — O210 Mild hyperemesis gravidarum: Secondary | ICD-10-CM | POA: Insufficient documentation

## 2012-02-06 DIAGNOSIS — O99891 Other specified diseases and conditions complicating pregnancy: Secondary | ICD-10-CM | POA: Insufficient documentation

## 2012-02-06 DIAGNOSIS — Z331 Pregnant state, incidental: Secondary | ICD-10-CM

## 2012-02-06 DIAGNOSIS — J069 Acute upper respiratory infection, unspecified: Secondary | ICD-10-CM | POA: Insufficient documentation

## 2012-02-06 DIAGNOSIS — R05 Cough: Secondary | ICD-10-CM | POA: Insufficient documentation

## 2012-02-06 DIAGNOSIS — R059 Cough, unspecified: Secondary | ICD-10-CM | POA: Insufficient documentation

## 2012-02-06 LAB — URINALYSIS, ROUTINE W REFLEX MICROSCOPIC
Bilirubin Urine: NEGATIVE
Specific Gravity, Urine: 1.015 (ref 1.005–1.030)
pH: 6 (ref 5.0–8.0)

## 2012-02-06 LAB — CBC
HCT: 32.8 % — ABNORMAL LOW (ref 36.0–46.0)
Hemoglobin: 11 g/dL — ABNORMAL LOW (ref 12.0–15.0)
RDW: 14.1 % (ref 11.5–15.5)
WBC: 7.8 10*3/uL (ref 4.0–10.5)

## 2012-02-06 LAB — COMPREHENSIVE METABOLIC PANEL
ALT: 12 U/L (ref 0–35)
Albumin: 3 g/dL — ABNORMAL LOW (ref 3.5–5.2)
Alkaline Phosphatase: 77 U/L (ref 39–117)
BUN: 4 mg/dL — ABNORMAL LOW (ref 6–23)
Chloride: 103 mEq/L (ref 96–112)
Potassium: 3.6 mEq/L (ref 3.5–5.1)
Total Bilirubin: 0.1 mg/dL — ABNORMAL LOW (ref 0.3–1.2)

## 2012-02-06 LAB — URINE MICROSCOPIC-ADD ON

## 2012-02-06 MED ORDER — ONDANSETRON 4 MG PO TBDP: 4.0000 mg | ORAL_TABLET | Freq: Three times a day (TID) | ORAL | Status: DC | PRN

## 2012-02-06 MED ORDER — LACTATED RINGERS IV BOLUS (SEPSIS)
500.0000 mL | Freq: Once | INTRAVENOUS | Status: AC
  Administered 2012-02-06: 1000 mL via INTRAVENOUS

## 2012-02-06 MED ORDER — ONDANSETRON HCL 4 MG/2ML IJ SOLN
4.0000 mg | Freq: Once | INTRAMUSCULAR | Status: AC
  Administered 2012-02-06: 4 mg via INTRAVENOUS
  Filled 2012-02-06: qty 2

## 2012-02-06 MED ORDER — AZITHROMYCIN 250 MG PO TABS: ORAL_TABLET | ORAL | Status: DC

## 2012-02-06 MED ORDER — AZITHROMYCIN 250 MG PO TABS
500.0000 mg | ORAL_TABLET | Freq: Once | ORAL | Status: AC
  Administered 2012-02-06: 500 mg via ORAL
  Filled 2012-02-06: qty 2

## 2012-02-06 MED ORDER — GUAIFENESIN ER 600 MG PO TB12
600.0000 mg | ORAL_TABLET | Freq: Once | ORAL | Status: AC
  Administered 2012-02-06: 600 mg via ORAL
  Filled 2012-02-06: qty 1

## 2012-02-06 NOTE — MAU Note (Signed)
Entire family has been sick; pt c/o coughing for past 3 days- coughing so hard that she vomits; c/o achy lower back for past 1-2 weeks; hx of SVT since high school;

## 2012-02-06 NOTE — MAU Note (Signed)
Pt states cough x3 days, had cold, stuffy nose, that began this week, has progressed. Whole family has had head cold, etc. Coughing very hard. Has hx of SVT, felt her heart slow down to point that she couldn't breath. Denies chest pain at present. No hx asthma.

## 2012-02-06 NOTE — MAU Provider Note (Signed)
History    Brianna Brooks has been a patient with [facility] from 198 weeks as had Ob care mostly at MAU prior to transfer of care. This AA women has been sick for 4 days with persistent dry cough which is making her vomit. She describes breathlessness when in a spell of coughing and it is hard for to get her breath during these episodes. Her whole family have had this URI for the past weeks and it appears that they have spread it to each other.   CSN: [id number]  Arrival date and time: 02/06/12 1218   None     Chief Complaint  Patient presents with  . URI   HPI  OB History    Grav Para Term Preterm Abortions TAB SAB Ect Mult Living   3 2 2       2       Past Medical History  Diagnosis Date  . SVT (supraventricular tachycardia)   . Abnormal Pap smear 06/2011    WAS TO HAVE COLPO;HAS NOT HAD YET, HAD + UPT  . Infection     UTI NOT FREQUENT  . Headache     MIGRAINES;USUALLY SLEEPS  . GERD (gastroesophageal reflux disease)     NOT FREQUENT, HAS MORE WITH PREGNANCY  . Fibroid     Past Surgical History  Procedure Date  . No past surgeries   . Hysteroscopy 06/28/2011    IN OFFICE IUD REMOVAL WITH [facility]  . Hysteroscopy     Family History  Problem Relation Age of Onset  . Anesthesia problems Neg Hx   . Heart attack Maternal Grandmother   . Hypertension Maternal Grandmother   . Heart disease Maternal Aunt     PACE MAKER  . Hypertension Maternal Aunt   . Hypertension Maternal Uncle   . Asthma Mother   . Cholelithiasis Mother   . Migraines Mother   . Asthma Sister   . Asthma Brother   . Asthma Maternal Aunt   . Cancer Maternal Aunt     OVARIAN  . Cancer Maternal Aunt     OVARIAN  . Cancer Paternal Grandfather     COLON  . Hyperthyroidism Paternal Grandmother     History  Substance Use Topics  . Smoking status: Never Smoker   . Smokeless tobacco: Never Used  . Alcohol Use: No    Allergies: No Known Allergies  Prescriptions prior to admission  Medication Sig Dispense  Refill  . calcium carbonate (TUMS - DOSED IN MG ELEMENTAL CALCIUM) 500 MG chewable tablet Chew 1-2 tablets by mouth daily.      . Prenatal Vit-Fe Fumarate-FA (PRENATAL MULTIVITAMIN) TABS Take 1 tablet by mouth daily.      . pseudoephedrine (SUDAFED) 30 MG tablet Take 30 mg by mouth 2 (two) times daily.      . ranitidine (ZANTAC) 75 MG tablet Take 75 mg by mouth 2 (two) times daily.        Review of Systems  Constitutional: Negative.   HENT: Negative.   Respiratory: Positive for cough.        Dry persistent cough x 4 days causing N&V  Cardiovascular: Positive for chest pain.       States that she has History of SVT and that she has had chest tightness when she is in a spell of coughing.  Gastrointestinal: Positive for nausea.       Nausea and vomiting from persistent dry coughing  Musculoskeletal: Negative.   Skin: Negative.   Neurological: Negative.  Endo/Heme/Allergies: Negative.   Psychiatric/Behavioral: Negative.    Physical Exam   Blood pressure 122/67, pulse 81, temperature 97.9 F (36.6 C), temperature source Oral, resp. rate 16, height 5\' 7"  (1.702 m), weight 222 lb (100.699 kg), last menstrual period 07/28/2011, SpO2 99.00%.  Physical Exam  Constitutional: She is oriented to person, place, and time. She appears well-developed and well-nourished.  HENT:  Head: Normocephalic and atraumatic.  Eyes: Conjunctivae normal are normal. Pupils are equal, round, and reactive to light.  Neck: Normal range of motion.  Cardiovascular: Normal rate, regular rhythm and normal heart sounds.   Respiratory: Effort normal and breath sounds normal.  GI: Soft. Bowel sounds are normal.  Genitourinary: Vagina normal.  Musculoskeletal: Normal range of motion.  Neurological: She is alert and oriented to person, place, and time. She has normal reflexes.  Skin: Skin is warm and dry.  Psychiatric: She has a normal mood and affect.    MAU Course  Procedures IV Hydration LR bolus IV  Zofran 4mg s x 1 dose  IV Azithromycin 500mg s x1 dose  Mucinex 600mg  po x 1 dose EKG to r/o SVT - normal sinus rhythm CBC  CMP  Assessment and Plan  URI Infection( Possibly Viral) with persisent dry non productive coughing causing Nausea and vomiting and breathlessness when in a spell of coughing which is leading to chest tightness and possible runs of SVT. The patient has a history of SVT and knows when she is having runs of SVT. The patient does not have a Cardiologist at this time. All labs WNLs  EKG: Sinus Rhythm Feeling better now and has not coughed since treatments. Now hungry and wants to eat. Requested discharge to home. Discharge to home on Z- Pak and also to Zofran readily available for nausea. F/u 02/11/12 at [facility], Brianna Brooks, CNM 02/06/2012, 2:05 PM

## 2012-02-06 NOTE — Telephone Encounter (Signed)
TC from pt. States has hx SVT. Has increased episodes x 2 weeks. States since 02/05/12 has not been able to control tachycardia. P-?. + back pain.  Decreased FM. No shortness of breath.  Pt does not have cardiologist. Per [facility] to [facility]U Pt verbalizes comprehension.  Message left with nurse answering for DD.

## 2012-02-14 ENCOUNTER — Encounter: Payer: Self-pay | Admitting: Obstetrics and Gynecology

## 2012-02-14 ENCOUNTER — Ambulatory Visit (INDEPENDENT_AMBULATORY_CARE_PROVIDER_SITE_OTHER): Payer: 59 | Admitting: Obstetrics and Gynecology

## 2012-02-14 VITALS — BP 120/80 | Wt 224.0 lb

## 2012-02-14 DIAGNOSIS — R05 Cough: Secondary | ICD-10-CM

## 2012-02-14 DIAGNOSIS — Z3490 Encounter for supervision of normal pregnancy, unspecified, unspecified trimester: Secondary | ICD-10-CM

## 2012-02-14 DIAGNOSIS — Z331 Pregnant state, incidental: Secondary | ICD-10-CM

## 2012-02-14 DIAGNOSIS — D573 Sickle-cell trait: Secondary | ICD-10-CM

## 2012-02-14 LAB — POCT URINALYSIS DIPSTICK
Ketones, UA: NEGATIVE
Leukocytes, UA: NEGATIVE
Protein, UA: NEGATIVE
Spec Grav, UA: <=1.005
pH, UA: 8

## 2012-02-14 MED ORDER — PSEUDOEPHEDRINE-GUAIFENESIN 30-100 MG/5ML PO SYRP: 5.0000 mL | ORAL_SOLUTION | ORAL | Status: DC | PRN

## 2012-02-14 NOTE — Progress Notes (Signed)
Pt c/o cough for the last 3-4 days, no relief with Delsym.

## 2012-02-14 NOTE — Progress Notes (Signed)
[redacted]w[redacted]d Had been seen triage for URI Persistent cough now - dry and tickles when talking. Lungs Clear Bilaterally. To prescribe cough Supressant. Robitussin PE 5ml Q 6 hrly PRN FH to dates FM+ Keeping Kick Count chart No change in vaginal secretions. ROB x 2 weeks Normal Glucola

## 2012-02-27 ENCOUNTER — Encounter: Payer: Self-pay | Admitting: Obstetrics and Gynecology

## 2012-02-27 DIAGNOSIS — I471 Supraventricular tachycardia: Secondary | ICD-10-CM | POA: Insufficient documentation

## 2012-02-28 ENCOUNTER — Encounter: Payer: Self-pay | Admitting: Obstetrics and Gynecology

## 2012-02-28 ENCOUNTER — Ambulatory Visit (INDEPENDENT_AMBULATORY_CARE_PROVIDER_SITE_OTHER): Payer: 59 | Admitting: Obstetrics and Gynecology

## 2012-02-28 VITALS — BP 116/80 | Wt 222.0 lb

## 2012-02-28 DIAGNOSIS — D573 Sickle-cell trait: Secondary | ICD-10-CM

## 2012-02-28 DIAGNOSIS — Z331 Pregnant state, incidental: Secondary | ICD-10-CM

## 2012-02-28 LAB — POCT URINALYSIS DIPSTICK
Bilirubin, UA: NEGATIVE
Ketones, UA: NEGATIVE
Spec Grav, UA: 1.01
pH, UA: 7

## 2012-02-28 NOTE — Progress Notes (Signed)
Patient ID: Brianna Brooks, female   DOB: August 13, 1985, 26 y.o.   MRN: [id number] [redacted]w[redacted]d Reviewed s/s preterm labor, srom, vag bleeding,daily kick counts to report, encouraged 8 water daily and frequent voids. Lavera Guise, CNM

## 2012-02-28 NOTE — Progress Notes (Signed)
Pt w/o complaint today.  

## 2012-03-12 ENCOUNTER — Ambulatory Visit (INDEPENDENT_AMBULATORY_CARE_PROVIDER_SITE_OTHER): Payer: 59 | Admitting: Obstetrics and Gynecology

## 2012-03-12 ENCOUNTER — Encounter: Payer: Self-pay | Admitting: Obstetrics and Gynecology

## 2012-03-12 VITALS — BP 118/74 | Wt 222.0 lb

## 2012-03-12 DIAGNOSIS — O26899 Other specified pregnancy related conditions, unspecified trimester: Secondary | ICD-10-CM

## 2012-03-12 DIAGNOSIS — Z3490 Encounter for supervision of normal pregnancy, unspecified, unspecified trimester: Secondary | ICD-10-CM

## 2012-03-12 DIAGNOSIS — N949 Unspecified condition associated with female genital organs and menstrual cycle: Secondary | ICD-10-CM

## 2012-03-12 DIAGNOSIS — K219 Gastro-esophageal reflux disease without esophagitis: Secondary | ICD-10-CM

## 2012-03-12 DIAGNOSIS — Z331 Pregnant state, incidental: Secondary | ICD-10-CM

## 2012-03-12 DIAGNOSIS — O9989 Other specified diseases and conditions complicating pregnancy, childbirth and the puerperium: Secondary | ICD-10-CM

## 2012-03-12 LAB — POCT URINALYSIS DIPSTICK
Bilirubin, UA: NEGATIVE
Glucose, UA: NEGATIVE
Ketones, UA: NEGATIVE
Spec Grav, UA: 1.01

## 2012-03-12 MED ORDER — RANITIDINE HCL 150 MG PO TABS: 150.0000 mg | ORAL_TABLET | Freq: Two times a day (BID) | ORAL | Status: DC

## 2012-03-12 NOTE — Progress Notes (Signed)
ROB follow up. Pt declines flu vaccine today. Pt would like cx checked today, due to increased pain and pressure.  C/o reflux not responding to Zantac 75 mg daily.  Prescription strength Zantac 150 mg at bedtime and a.m. given

## 2012-03-12 NOTE — Patient Instructions (Signed)
Diet for Gastroesophageal Reflux Disease, Adult Reflux (acid reflux) is when acid from your stomach flows up into the esophagus. When acid comes in contact with the esophagus, the acid causes irritation and soreness (inflammation) in the esophagus. When reflux happens often or so severely that it causes damage to the esophagus, it is called gastroesophageal reflux disease (GERD). Nutrition therapy can help ease the discomfort of GERD. FOODS OR DRINKS TO AVOID OR LIMIT  Smoking or chewing tobacco. Nicotine is one of the most potent stimulants to acid production in the gastrointestinal tract.  Caffeinated and decaffeinated coffee and black tea.  Regular or low-calorie carbonated beverages or energy drinks (caffeine-free carbonated beverages are allowed).   Strong spices, such as black pepper, white pepper, red pepper, cayenne, curry powder, and chili powder.  Peppermint or spearmint.  Chocolate.  High-fat foods, including meats and fried foods. Extra added fats including oils, butter, salad dressings, and nuts. Limit these to less than 8 tsp per day.  Fruits and vegetables if they are not tolerated, such as citrus fruits or tomatoes.  Alcohol.  Any food that seems to aggravate your condition. If you have questions regarding your diet, call your caregiver or a registered dietitian. OTHER THINGS THAT MAY HELP GERD INCLUDE:   Eating your meals slowly, in a relaxed setting.  Eating 5 to 6 small meals per day instead of 3 large meals.  Eliminating food for a period of time if it causes distress.  Not lying down until 3 hours after eating a meal.  Keeping the head of your bed raised 6 to 9 inches (15 to 23 cm) by using a foam wedge or blocks under the legs of the bed. Lying flat may make symptoms worse.  Being physically active. Weight loss may be helpful in reducing reflux in overweight or obese adults.  Wear loose fitting clothing EXAMPLE MEAL PLAN This meal plan is approximately  2,000 calories based on https://www.bernard.org/ meal planning guidelines. Breakfast   cup cooked oatmeal.  1 cup strawberries.  1 cup low-fat milk.  1 oz almonds. Snack  1 cup cucumber slices.  6 oz yogurt (made from low-fat or fat-free milk). Lunch  2 slice whole-wheat bread.  2 oz sliced [place].  2 tsp mayonnaise.  1 cup blueberries.  1 cup snap peas. Snack  6 whole-wheat crackers.  1 oz string cheese. Dinner   cup brown rice.  1 cup mixed veggies.  1 tsp olive oil.  3 oz grilled fish. Document Released: 05/06/2005 Document Revised: 07/29/2011 Document Reviewed: 03/22/2011 [facility] Patient Information 2013 [place], [place]. Heartburn During Pregnancy  Heartburn is a burning sensation in the chest caused by stomach acid backing up into the esophagus. Heartburn (also known as "reflux") is common in pregnancy because a certain hormone (progesterone) changes. The progesterone hormone may relax the valve that separates the esophagus from the stomach. This allows acid to go up into the esophagus, causing heartburn. Heartburn may also happen in pregnancy because the enlarging uterus pushes up on the stomach, which pushes more acid into the esophagus. This is especially true in the later stages of pregnancy. Heartburn problems usually go away after giving birth. CAUSES   The progesterone hormone.  Changing hormone levels.  The growing uterus that pushes stomach acid upward.  Large meals.  Certain foods and drinks.  Exercise.  Increased acid production. SYMPTOMS   Burning pain in the chest or lower throat.  Bitter taste in the mouth.  Coughing. DIAGNOSIS  Heartburn is typically diagnosed  by your caregiver when taking a careful history of your concern. Your caregiver may order a blood test to check for a certain type of bacteria that is associated with heartburn. Sometimes, heartburn is diagnosed by prescribing a heartburn medicine to see if the symptoms  improve. It is rare in pregnancy to have a procedure called an endoscopy. This is when a tube with a light and a camera on the end is used to examine the esophagus and the stomach. TREATMENT   Your caregiver may tell you to use certain over-the-counter medicines (antacids, acid reducers) for mild heartburn.  Your caregiver may prescribe medicines to decrease stomach acid or to protect your stomach lining.  Your caregiver may recommend certain diet changes.  For severe cases, your caregiver may recommend that the head of the bed be elevated on blocks. (Sleeping with more pillows is not an effective treatment as it only changes the position of your head and does not improve the main problem of stomach acid refluxing into the esophagus.) HOME CARE INSTRUCTIONS   Take all medicines as directed by your caregiver.  Raise the head of your bed by putting blocks under the legs if instructed to by your caregiver.  Do not exercise right after eating.  Avoid eating 2 or 3 hours before bed. Do not lie down right after eating.  Eat small meals throughout the day instead of 3 large meals.  Identify foods and beverages that make your symptoms worse and avoid them. Foods you may want to avoid include:  Peppers.  Chocolate.  High-fat foods, including fried foods.  Spicy foods.  Garlic and onions.  Citrus fruits, including oranges, grapefruit, lemons, and limes.  Food containing tomatoes or tomato products.  Mint.  Carbonated and caffeinated drinks.  Vinegar. SEEK IMMEDIATE MEDICAL CARE IF:   You have severe chest pain that goes down your arm or into your jaw or neck.  You feel sweaty, dizzy, or lightheaded.  You become short of breath.  You vomit blood.  You have difficulty or pain with swallowing.  You have bloody or black, tarry stools.  You have episodes of heartburn more than 3 times a week, for more than 2 weeks. MAKE SURE YOU:  Understand these instructions.  Will  watch your condition.  Will get help right away if you are not doing well or get worse. Document Released: 05/03/2000 Document Revised: 07/29/2011 Document Reviewed: 10/25/2010 [facility] Patient Information 2013 [place], [place].

## 2012-03-27 ENCOUNTER — Encounter: Payer: Self-pay | Admitting: Obstetrics and Gynecology

## 2012-03-27 ENCOUNTER — Ambulatory Visit (INDEPENDENT_AMBULATORY_CARE_PROVIDER_SITE_OTHER): Payer: 59 | Admitting: Obstetrics and Gynecology

## 2012-03-27 VITALS — BP 124/60 | Wt 225.0 lb

## 2012-03-27 DIAGNOSIS — Z331 Pregnant state, incidental: Secondary | ICD-10-CM

## 2012-03-27 NOTE — Progress Notes (Signed)
[redacted]w[redacted]d  GFM

## 2012-03-27 NOTE — Progress Notes (Signed)
[redacted]w[redacted]d. No complaints.  Declines flu shot

## 2012-03-31 ENCOUNTER — Telehealth: Payer: Self-pay | Admitting: Obstetrics and Gynecology

## 2012-04-01 ENCOUNTER — Encounter: Payer: Self-pay | Admitting: Obstetrics and Gynecology

## 2012-04-01 ENCOUNTER — Ambulatory Visit (INDEPENDENT_AMBULATORY_CARE_PROVIDER_SITE_OTHER): Payer: 59 | Admitting: Obstetrics and Gynecology

## 2012-04-01 VITALS — BP 120/70 | Wt 224.0 lb

## 2012-04-01 DIAGNOSIS — O26849 Uterine size-date discrepancy, unspecified trimester: Secondary | ICD-10-CM

## 2012-04-01 DIAGNOSIS — Z331 Pregnant state, incidental: Secondary | ICD-10-CM

## 2012-04-01 NOTE — Progress Notes (Signed)
GBS today. Concerned regarding previous babies at 5 and 6 lbs--was told this baby was 7 1/2 lbs at LV (not by [place]). Issue reviewed.  Also had hx oligo last pregnancy. Will do [place] for growth and fluid and NV.

## 2012-04-01 NOTE — Addendum Note (Signed)
Addended by: Janeece Agee on: 04/01/2012 09:25 AM   Modules accepted: Orders

## 2012-04-01 NOTE — Progress Notes (Signed)
[redacted]w[redacted]d No concerns per pt

## 2012-04-03 LAB — STREP B DNA PROBE: GBSP: NEGATIVE

## 2012-04-08 ENCOUNTER — Ambulatory Visit (INDEPENDENT_AMBULATORY_CARE_PROVIDER_SITE_OTHER): Payer: 59

## 2012-04-08 ENCOUNTER — Encounter: Payer: Self-pay | Admitting: Obstetrics and Gynecology

## 2012-04-08 ENCOUNTER — Ambulatory Visit (INDEPENDENT_AMBULATORY_CARE_PROVIDER_SITE_OTHER): Payer: 59 | Admitting: Obstetrics and Gynecology

## 2012-04-08 VITALS — BP 120/62 | Wt 226.0 lb

## 2012-04-08 DIAGNOSIS — O26849 Uterine size-date discrepancy, unspecified trimester: Secondary | ICD-10-CM

## 2012-04-08 DIAGNOSIS — Z331 Pregnant state, incidental: Secondary | ICD-10-CM

## 2012-04-08 NOTE — Progress Notes (Signed)
EFW 6-5 44% AFI 16.2 cm vtx  Posterior placenta Pt c/o right hip pain that radiates to her thigh Physical Examination: Musculoskeletal - no joint tenderness, deformity or swelling LE neg for calf pain and homans sign bilaterally

## 2012-04-08 NOTE — Progress Notes (Signed)
Pt c/o right leg pain.

## 2012-04-14 ENCOUNTER — Ambulatory Visit (INDEPENDENT_AMBULATORY_CARE_PROVIDER_SITE_OTHER): Payer: 59 | Admitting: Obstetrics and Gynecology

## 2012-04-14 ENCOUNTER — Encounter: Payer: Self-pay | Admitting: Obstetrics and Gynecology

## 2012-04-14 VITALS — BP 114/70 | Wt 228.0 lb

## 2012-04-14 DIAGNOSIS — Z331 Pregnant state, incidental: Secondary | ICD-10-CM

## 2012-04-14 DIAGNOSIS — Z3490 Encounter for supervision of normal pregnancy, unspecified, unspecified trimester: Secondary | ICD-10-CM

## 2012-04-14 NOTE — Progress Notes (Signed)
[redacted]w[redacted]d No complaints FKCs and Labor Precautions RTO 1wk Pt reports h/o neg echo

## 2012-04-20 ENCOUNTER — Encounter: Payer: 59 | Admitting: Obstetrics and Gynecology

## 2012-04-20 LAB — US OB FOLLOW UP

## 2012-04-21 ENCOUNTER — Telehealth: Payer: Self-pay | Admitting: Obstetrics and Gynecology

## 2012-04-22 ENCOUNTER — Ambulatory Visit (INDEPENDENT_AMBULATORY_CARE_PROVIDER_SITE_OTHER): Payer: 59 | Admitting: Obstetrics and Gynecology

## 2012-04-22 ENCOUNTER — Encounter: Payer: Self-pay | Admitting: Obstetrics and Gynecology

## 2012-04-22 VITALS — BP 132/72 | Wt 229.0 lb

## 2012-04-22 DIAGNOSIS — Z3490 Encounter for supervision of normal pregnancy, unspecified, unspecified trimester: Secondary | ICD-10-CM

## 2012-04-22 DIAGNOSIS — Z331 Pregnant state, incidental: Secondary | ICD-10-CM

## 2012-04-22 NOTE — Progress Notes (Signed)
[redacted]w[redacted]d No complaints except pressure FKCs and Labor Precautions RTO 1wk Repeat BP 130/70 and no sxs

## 2012-04-26 ENCOUNTER — Inpatient Hospital Stay (HOSPITAL_COMMUNITY)
Admission: AD | Admit: 2012-04-26 | Discharge: 2012-04-26 | Disposition: A | Discharge status: Home / Self Care | Payer: 59 | Source: Ambulatory Visit | Attending: Obstetrics and Gynecology | Admitting: Obstetrics and Gynecology

## 2012-04-26 ENCOUNTER — Encounter (HOSPITAL_COMMUNITY): Payer: Self-pay | Admitting: *Deleted

## 2012-04-26 DIAGNOSIS — O479 False labor, unspecified: Secondary | ICD-10-CM | POA: Insufficient documentation

## 2012-04-26 NOTE — MAU Note (Signed)
Pt states contractions started around 12:00 this afternoon. She feels like they have become about 2-3 minutes apart. Rating pain a 2.

## 2012-04-26 NOTE — MAU Provider Note (Signed)
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  States ctxs irregular until about 4pm, and then stopped for an hour, then returned to about every 2-4 min until she got to MAU, and now feels have spaced again.  No LOF or VB.  Tried shower and did help.  ctxs more frequent w/ walking.  GFM.  No resp or GI c/o's. No fever or illness.  Reports cx 2/80 in office at last exam.  Rates ctxs 2/10.  Accompanied by her daughters and her sister and s.o. Marland Kitchen. Patient Active Problem List  Diagnosis  . ASTHMA NOS W/ACUTE EXACERBATION  . GERD  . HEADACHE  . CARDIOMEGALY, MILD  . Sickle cell trait  . LSIL (low grade squamous intraepithelial lesion) on Pap smear  . Persistent dry cough  . SVT (supraventricular tachycardia)  . Normal pregnancy    CSN: [id number]  Arrival date and time: 04/26/12 1954   First Provider Initiated Contact with Patient 04/26/12 2120      Chief Complaint  Patient presents with  . Labor Eval   HPI  OB History    Grav Para Term Preterm Abortions TAB SAB Ect Mult Living   3 2 2       2       Past Medical History  Diagnosis Date  . SVT (supraventricular tachycardia)   . Abnormal Pap smear 06/2011    WAS TO HAVE COLPO;HAS NOT HAD YET, HAD + UPT  . Infection     UTI NOT FREQUENT  . Headache     MIGRAINES;USUALLY SLEEPS  . GERD (gastroesophageal reflux disease)     NOT FREQUENT, HAS MORE WITH PREGNANCY  . Fibroid     Past Surgical History  Procedure Date  . No past surgeries   . Hysteroscopy 06/28/2011    IN OFFICE IUD REMOVAL WITH [facility]  . Hysteroscopy     Family History  Problem Relation Age of Onset  . Anesthesia problems Neg Hx   . Heart attack Maternal Grandmother   . Hypertension Maternal Grandmother   . Heart disease Maternal Aunt     PACE MAKER  . Hypertension Maternal Aunt   . Hypertension Maternal Uncle   . Asthma Mother   . Cholelithiasis Mother   . Migraines Mother   . Asthma  Sister   . Asthma Brother   . Asthma Maternal Aunt   . Cancer Maternal Aunt     OVARIAN  . Cancer Maternal Aunt     OVARIAN  . Cancer Paternal Grandfather     COLON  . Hyperthyroidism Paternal Grandmother     History  Substance Use Topics  . Smoking status: Never Smoker   . Smokeless tobacco: Never Used  . Alcohol Use: No    Allergies: No Known Allergies  Prescriptions prior to admission  Medication Sig Dispense Refill  . Prenatal Vit-Fe Fumarate-FA (PRENATAL MULTIVITAMIN) TABS Take 1 tablet by mouth daily.      . ranitidine (ZANTAC) 150 MG tablet Take 150 mg by mouth 2 (two) times daily.        ROS--see HPI  Physical Exam   Blood pressure 136/78, pulse 72, temperature 97.2 F (36.2 C), temperature source Oral, resp. rate 20, height 5\' 7"  (1.702 m), weight 229 lb (103.874 kg), last menstrual period 07/28/2011.  Physical Exam  Constitutional: She is oriented to person, place, and time. She appears well-developed and well-nourished. No  distress.       Pleasant and smiling  HENT:  Head: Normocephalic and atraumatic.  Eyes: Pupils are equal, round, and reactive to light.  Cardiovascular: Normal rate.   Respiratory: Effort normal.  GI: Soft.       gravid  Genitourinary:       Cx: 1.5/60-70/-2, posterior to pt's Lt; vtx, intact  Neurological: She is alert and oriented to person, place, and time.  Skin: Skin is warm and dry.  Psychiatric: She has a normal mood and affect. Her behavior is normal. Judgment and thought content normal.    MAU Course  Procedures 1. NST  Assessment and Plan  1. 39 weeks 2. Threatened labor at term 3. No cervical change 4. Reactive NST  1. D/c home w/ labor precautions and FKC 2. F/u 04/30/12, or prn 3. Declined meds for rest or pain Jurnee Nakayama H 04/26/2012, 9:21 PM

## 2012-04-28 ENCOUNTER — Inpatient Hospital Stay (HOSPITAL_COMMUNITY)
Admission: AD | Admit: 2012-04-28 | Discharge: 2012-04-28 | Disposition: A | Discharge status: Home / Self Care | Payer: 59 | Source: Ambulatory Visit | Attending: Obstetrics and Gynecology | Admitting: Obstetrics and Gynecology

## 2012-04-28 ENCOUNTER — Encounter: Payer: 59 | Admitting: Obstetrics and Gynecology

## 2012-04-28 ENCOUNTER — Encounter (HOSPITAL_COMMUNITY): Payer: Self-pay

## 2012-04-28 ENCOUNTER — Telehealth: Payer: Self-pay | Admitting: Obstetrics and Gynecology

## 2012-04-28 DIAGNOSIS — O479 False labor, unspecified: Secondary | ICD-10-CM

## 2012-04-28 DIAGNOSIS — O4700 False labor before 37 completed weeks of gestation, unspecified trimester: Secondary | ICD-10-CM | POA: Insufficient documentation

## 2012-04-28 NOTE — MAU Note (Signed)
Pt states contractions started around noon, but started to get more intense around 3pm. States they are 2-3 minutes

## 2012-04-28 NOTE — Telephone Encounter (Signed)
IC from pt w ?SROM, states she's been having irregular ctx, since this AM, denies VB, GFM. States she felt like her under clothes were "damp" not continuing to having any leaking now, instructed to do pad test and call back in 1-2 hours, or come to MAU for eval.

## 2012-04-28 NOTE — MAU Provider Note (Signed)
  History     CSN: [id number]  Arrival date and time: 04/28/12 2023   None     Chief Complaint  Patient presents with  . Labor Eval   HPI Comments: Pt is  G3P2 w c/o ?ROM, and ctx, denies VB, GFM.      Past Medical History  Diagnosis Date  . SVT (supraventricular tachycardia)   . Abnormal Pap smear 06/2011    WAS TO HAVE COLPO;HAS NOT HAD YET, HAD + UPT  . Infection     UTI NOT FREQUENT  . Headache     MIGRAINES;USUALLY SLEEPS  . GERD (gastroesophageal reflux disease)     NOT FREQUENT, HAS MORE WITH PREGNANCY  . Fibroid     Past Surgical History  Procedure Date  . No past surgeries   . Hysteroscopy 06/28/2011    IN OFFICE IUD REMOVAL WITH [facility]  . Hysteroscopy     Family History  Problem Relation Age of Onset  . Anesthesia problems Neg Hx   . Heart attack Maternal Grandmother   . Hypertension Maternal Grandmother   . Heart disease Maternal Aunt     PACE MAKER  . Hypertension Maternal Aunt   . Hypertension Maternal Uncle   . Asthma Mother   . Cholelithiasis Mother   . Migraines Mother   . Asthma Sister   . Asthma Brother   . Asthma Maternal Aunt   . Cancer Maternal Aunt     OVARIAN  . Cancer Maternal Aunt     OVARIAN  . Cancer Paternal Grandfather     COLON  . Hyperthyroidism Paternal Grandmother     History  Substance Use Topics  . Smoking status: Never Smoker   . Smokeless tobacco: Never Used  . Alcohol Use: No    Allergies: No Known Allergies  Prescriptions prior to admission  Medication Sig Dispense Refill  . Prenatal Vit-Fe Fumarate-FA (PRENATAL MULTIVITAMIN) TABS Take 1 tablet by mouth daily.      . ranitidine (ZANTAC) 150 MG tablet Take 150 mg by mouth 2 (two) times daily.        Review of Systems  All other systems reviewed and are negative.   Physical Exam   Blood pressure 137/78, pulse 91, temperature 97.8 F (36.6 C), temperature source Oral, resp. rate 20, last menstrual period 07/28/2011.  Physical Exam  Nursing note and  vitals reviewed. Constitutional: She is oriented to person, place, and time. She appears well-developed and well-nourished.  HENT:  Head: Normocephalic.  Eyes: Pupils are equal, round, and reactive to light.  Neck: Normal range of motion.  Cardiovascular: Normal rate, regular rhythm and normal heart sounds.   Respiratory: Effort normal and breath sounds normal.  GI: Soft. Bowel sounds are normal.  Genitourinary: Vagina normal.  Musculoskeletal: Normal range of motion.  Neurological: She is alert and oriented to person, place, and time. She has normal reflexes.  Skin: Skin is warm and dry.  Psychiatric: She has a normal mood and affect. Her behavior is normal.    MAU Course  Procedures    Assessment and Plan  IUP at term Not in labor No evidence of SROM D/c home  Binnie Vonderhaar M 04/28/2012, 9:03 PM

## 2012-04-30 ENCOUNTER — Encounter: Payer: Self-pay | Admitting: Obstetrics and Gynecology

## 2012-04-30 ENCOUNTER — Ambulatory Visit (INDEPENDENT_AMBULATORY_CARE_PROVIDER_SITE_OTHER): Payer: 59

## 2012-04-30 ENCOUNTER — Telehealth: Payer: Self-pay | Admitting: Obstetrics and Gynecology

## 2012-04-30 ENCOUNTER — Ambulatory Visit (INDEPENDENT_AMBULATORY_CARE_PROVIDER_SITE_OTHER): Payer: 59 | Admitting: Obstetrics and Gynecology

## 2012-04-30 VITALS — BP 118/76 | Wt 227.0 lb

## 2012-04-30 DIAGNOSIS — Z331 Pregnant state, incidental: Secondary | ICD-10-CM

## 2012-04-30 NOTE — Progress Notes (Signed)
Pt stated been having some contractions not that bad . Was seen @ MAU on 04/28/12 and stated she was leaking fluid shelly told her it was just cervical fluid. Pt is confused . Can you please explain to pt the difference to pt today. Pt wants a cervix check today .

## 2012-04-30 NOTE — Telephone Encounter (Signed)
Induction scheduled for 05/01/12 @ 7:30am with [facility]/MK. -Adrianne Pridgen

## 2012-04-30 NOTE — Telephone Encounter (Signed)
Induction will be 12/17 0700 per Dr. Normand Sloop as this is her call day. Lavera Guise, CNM

## 2012-04-30 NOTE — Progress Notes (Unsigned)
Patient ID: Brianna Brooks, female   DOB: March 11, 1986, 26 y.o.   MRN: [id number] I spoke wth Dr Pennie Rushing.  She does not agree the pt had an adequate bishop score for induction tomorrow.  I have called the pt she is at work.  I will change the induction to my day on call 12/17.

## 2012-04-30 NOTE — Progress Notes (Signed)
[redacted]w[redacted]d A/P GBS negative Fetal kick counts reviewed Labor reviewed with pt All patients  questions answered nitrizene and ferning both negative Pt desires social induction.  Will schedule [place] today AFI 13.02 cm vtx

## 2012-05-01 ENCOUNTER — Telehealth: Payer: Self-pay | Admitting: Obstetrics and Gynecology

## 2012-05-01 ENCOUNTER — Inpatient Hospital Stay (HOSPITAL_COMMUNITY): Admission: RE | Admit: 2012-05-01 | Payer: 59 | Source: Ambulatory Visit

## 2012-05-01 LAB — US OB FOLLOW UP

## 2012-05-05 ENCOUNTER — Encounter (HOSPITAL_COMMUNITY): Payer: Self-pay

## 2012-05-05 ENCOUNTER — Inpatient Hospital Stay (HOSPITAL_COMMUNITY)
Admission: AD | Admit: 2012-05-05 | Discharge: 2012-05-07 | DRG: 775 | Disposition: A | Discharge status: Home / Self Care | Payer: 59 | Source: Ambulatory Visit | Attending: Obstetrics and Gynecology | Admitting: Obstetrics and Gynecology

## 2012-05-05 DIAGNOSIS — I498 Other specified cardiac arrhythmias: Secondary | ICD-10-CM | POA: Diagnosis present

## 2012-05-05 DIAGNOSIS — O9902 Anemia complicating childbirth: Principal | ICD-10-CM | POA: Diagnosis present

## 2012-05-05 DIAGNOSIS — D573 Sickle-cell trait: Secondary | ICD-10-CM | POA: Diagnosis present

## 2012-05-05 DIAGNOSIS — I471 Supraventricular tachycardia: Secondary | ICD-10-CM

## 2012-05-05 DIAGNOSIS — IMO0002 Reserved for concepts with insufficient information to code with codable children: Secondary | ICD-10-CM

## 2012-05-05 LAB — CBC
HCT: 33.9 % — ABNORMAL LOW (ref 36.0–46.0)
Hemoglobin: 11.2 g/dL — ABNORMAL LOW (ref 12.0–15.0)
MCH: 23.8 pg — ABNORMAL LOW (ref 26.0–34.0)
MCHC: 33 g/dL (ref 30.0–36.0)
MCV: 72 fL — ABNORMAL LOW (ref 78.0–100.0)
Platelets: 265 10*3/uL (ref 150–400)
RBC: 4.71 MIL/uL (ref 3.87–5.11)
RDW: 16 % — ABNORMAL HIGH (ref 11.5–15.5)
WBC: 8.2 10*3/uL (ref 4.0–10.5)

## 2012-05-05 LAB — SYPHILIS: RPR W/REFLEX TO RPR TITER AND TREPONEMAL ANTIBODIES, TRADITIONAL SCREENING AND DIAGNOSIS ALGORITHM: RPR Ser Ql: NONREACTIVE

## 2012-05-05 LAB — TYPE AND SCREEN
ABO/RH(D): B POS
Antibody Screen: NEGATIVE

## 2012-05-05 MED ORDER — ACETAMINOPHEN 325 MG PO TABS: 650.0000 mg | ORAL_TABLET | ORAL | Status: DC | PRN

## 2012-05-05 MED ORDER — OXYCODONE-ACETAMINOPHEN 5-325 MG PO TABS
1.0000 | ORAL_TABLET | ORAL | Status: DC | PRN
  Administered 2012-05-05 – 2012-05-06 (×4): 1 via ORAL
  Filled 2012-05-05 (×4): qty 1

## 2012-05-05 MED ORDER — SIMETHICONE 80 MG PO CHEW: 80.0000 mg | CHEWABLE_TABLET | ORAL | Status: DC | PRN

## 2012-05-05 MED ORDER — PHENYLEPHRINE 40 MCG/ML (10ML) SYRINGE FOR IV PUSH (FOR BLOOD PRESSURE SUPPORT): 80.0000 ug | PREFILLED_SYRINGE | INTRAVENOUS | Status: DC | PRN

## 2012-05-05 MED ORDER — OXYCODONE-ACETAMINOPHEN 5-325 MG PO TABS: 1.0000 | ORAL_TABLET | ORAL | Status: DC | PRN

## 2012-05-05 MED ORDER — LACTATED RINGERS IV SOLN: 500.0000 mL | INTRAVENOUS | Status: DC | PRN

## 2012-05-05 MED ORDER — ONDANSETRON HCL 4 MG PO TABS: 4.0000 mg | ORAL_TABLET | ORAL | Status: DC | PRN

## 2012-05-05 MED ORDER — FLEET ENEMA 7-19 GM/118ML RE ENEM: 1.0000 | ENEMA | RECTAL | Status: DC | PRN

## 2012-05-05 MED ORDER — PRENATAL MULTIVITAMIN CH
1.0000 | ORAL_TABLET | Freq: Every day | ORAL | Status: DC
  Administered 2012-05-06 – 2012-05-07 (×2): 1 via ORAL
  Filled 2012-05-05 (×2): qty 1

## 2012-05-05 MED ORDER — LANOLIN HYDROUS EX OINT: TOPICAL_OINTMENT | CUTANEOUS | Status: DC | PRN

## 2012-05-05 MED ORDER — FENTANYL 2.5 MCG/ML BUPIVACAINE 1/10 % EPIDURAL INFUSION (WH - ANES): 14.0000 mL/h | INTRAMUSCULAR | Status: DC

## 2012-05-05 MED ORDER — LIDOCAINE HCL (PF) 1 % IJ SOLN
30.0000 mL | INTRAMUSCULAR | Status: DC | PRN
  Filled 2012-05-05: qty 30

## 2012-05-05 MED ORDER — DIPHENHYDRAMINE HCL 50 MG/ML IJ SOLN: 12.5000 mg | INTRAMUSCULAR | Status: DC | PRN

## 2012-05-05 MED ORDER — LACTATED RINGERS IV SOLN
INTRAVENOUS | Status: DC
  Administered 2012-05-05: 10:00:00 via INTRAVENOUS

## 2012-05-05 MED ORDER — DIPHENHYDRAMINE HCL 25 MG PO CAPS: 25.0000 mg | ORAL_CAPSULE | Freq: Four times a day (QID) | ORAL | Status: DC | PRN

## 2012-05-05 MED ORDER — LACTATED RINGERS IV SOLN: 500.0000 mL | Freq: Once | INTRAVENOUS | Status: DC

## 2012-05-05 MED ORDER — TETANUS-DIPHTH-ACELL PERTUSSIS 5-2.5-18.5 LF-MCG/0.5 IM SUSP
0.5000 mL | Freq: Once | INTRAMUSCULAR | Status: AC
  Administered 2012-05-06: 0.5 mL via INTRAMUSCULAR
  Filled 2012-05-05: qty 0.5

## 2012-05-05 MED ORDER — ONDANSETRON HCL 4 MG/2ML IJ SOLN: 4.0000 mg | INTRAMUSCULAR | Status: DC | PRN

## 2012-05-05 MED ORDER — OXYTOCIN 40 UNITS IN LACTATED RINGERS INFUSION - SIMPLE MED
62.5000 mL/h | INTRAVENOUS | Status: DC
  Filled 2012-05-05: qty 1000

## 2012-05-05 MED ORDER — CITRIC ACID-SODIUM CITRATE 334-500 MG/5ML PO SOLN: 30.0000 mL | ORAL | Status: DC | PRN

## 2012-05-05 MED ORDER — BUTORPHANOL TARTRATE 1 MG/ML IJ SOLN
1.0000 mg | INTRAMUSCULAR | Status: DC | PRN
  Administered 2012-05-05 (×2): 1 mg via INTRAVENOUS
  Filled 2012-05-05 (×2): qty 1

## 2012-05-05 MED ORDER — ZOLPIDEM TARTRATE 5 MG PO TABS: 5.0000 mg | ORAL_TABLET | Freq: Every evening | ORAL | Status: DC | PRN

## 2012-05-05 MED ORDER — OXYTOCIN BOLUS FROM INFUSION
500.0000 mL | INTRAVENOUS | Status: DC
  Administered 2012-05-05: 500 mL via INTRAVENOUS

## 2012-05-05 MED ORDER — WITCH HAZEL-GLYCERIN EX PADS: 1.0000 "application " | MEDICATED_PAD | CUTANEOUS | Status: DC | PRN

## 2012-05-05 MED ORDER — EPHEDRINE 5 MG/ML INJ: 10.0000 mg | INTRAVENOUS | Status: DC | PRN

## 2012-05-05 MED ORDER — ONDANSETRON HCL 4 MG/2ML IJ SOLN
4.0000 mg | Freq: Four times a day (QID) | INTRAMUSCULAR | Status: DC | PRN
  Administered 2012-05-05: 4 mg via INTRAVENOUS
  Filled 2012-05-05: qty 2

## 2012-05-05 MED ORDER — SENNOSIDES-DOCUSATE SODIUM 8.6-50 MG PO TABS
2.0000 | ORAL_TABLET | Freq: Every day | ORAL | Status: DC
  Administered 2012-05-05 – 2012-05-06 (×2): 2 via ORAL

## 2012-05-05 MED ORDER — BENZOCAINE-MENTHOL 20-0.5 % EX AERO: 1.0000 "application " | INHALATION_SPRAY | CUTANEOUS | Status: DC | PRN

## 2012-05-05 MED ORDER — IBUPROFEN 600 MG PO TABS
600.0000 mg | ORAL_TABLET | Freq: Four times a day (QID) | ORAL | Status: DC | PRN
  Administered 2012-05-05: 600 mg via ORAL
  Filled 2012-05-05: qty 1

## 2012-05-05 MED ORDER — IBUPROFEN 600 MG PO TABS
600.0000 mg | ORAL_TABLET | Freq: Four times a day (QID) | ORAL | Status: DC
  Administered 2012-05-06 – 2012-05-07 (×6): 600 mg via ORAL
  Filled 2012-05-05 (×7): qty 1

## 2012-05-05 MED ORDER — DIBUCAINE 1 % RE OINT: 1.0000 "application " | TOPICAL_OINTMENT | RECTAL | Status: DC | PRN

## 2012-05-05 NOTE — Progress Notes (Signed)
  Subjective: Just had large gush of clear fluid.  UCs somewhat stronger than at last evaluation.  Objective: BP 130/68  Pulse 75  Temp 98.8 F (37.1 C) (Oral)  Resp 20  Ht 5\' 7"  (1.702 m)  Wt 229 lb (103.874 kg)  BMI 35.87 kg/m2  LMP 07/28/2011      FHT:  Category 1 UC:   regular, every 3-4 minutes  Assessment / Plan: Will allow to walk per patient request--re-evaluate at 2pm for need for augmentation.  Nigel Bridgeman 05/05/2012, 1:34 PM

## 2012-05-05 NOTE — Progress Notes (Signed)
  Subjective: Much more uncomfortable with contractions.  Standing at bedside, using birthing ball.  Declines epidural.  Up to BR, had BM and vomited.  Objective: BP 127/62  Pulse 78  Temp 98.1 F (36.7 C) (Oral)  Resp 20  Ht 5\' 7"  (1.702 m)  Wt 229 lb (103.874 kg)  BMI 35.87 kg/m2  LMP 07/28/2011      FHT:  Reassuring on intermittent auscultation UC:   regular, every 3 minutes SVE:   Dilation: 6.5 Effacement (%): 70 Station: -1 Exam by:: Emilee Hero, CNM Cervix unchanged, but vtx lower in pelvis and well-applied.  Assessment / Plan: Will CTO Zofran now Offered Stadol--patient does want. Re-check in 1 hour for progress--augment if no progress at that time.  Nigel Bridgeman 05/05/2012, 3:12 PM

## 2012-05-05 NOTE — Progress Notes (Signed)
  Subjective: Breathing with contractions.  More uncomfortable.  Objective: BP 127/62  Pulse 78  Temp 98.1 F (36.7 C) (Oral)  Resp 20  Ht 5\' 7"  (1.702 m)  Wt 229 lb (103.874 kg)  BMI 35.87 kg/m2  LMP 07/28/2011      FHT:  Reassuring per intermittent auscultation UC:   regular, every 3 minutes SVE:   Cervix 5-6 cm, 70%, vtx, -2. Leaking clear fluid.  Assessment / Plan: Will CTO at present Declines pain medication at present.  Nigel Bridgeman 05/05/2012, 3:16 PM

## 2012-05-05 NOTE — Progress Notes (Signed)
  Subjective: Aware of contractions as stronger since AROM attempt at 9:50am, with small amount of clear fluid noted at that time. Still resting comfortably.  Objective: BP 130/68  Pulse 75  Temp 98.8 F (37.1 C) (Oral)  Resp 20  Ht 5\' 7"  (1.702 m)  Wt 229 lb (103.874 kg)  BMI 35.87 kg/m2  LMP 07/28/2011      FHT: Category 1 UC:  q 3-5 min, mild  Assessment / Plan: Will CTO at present--if no change in labor status by 1pm, will augment. Per discussion with Dr. Normand Sloop and the patient, I will manage her care and attend her delivery if it occurs before 7p.  Brianna Brooks 05/05/2012, 11:55 AM

## 2012-05-05 NOTE — H&P (Signed)
Brianna Brooks is a 26 y.o. female, G3P2002 at 50 2/7 weeks, presenting in early labor.  Was scheduled for induction later this week, but called the office to report contractions.  Had requested MD delivery.  Dr. Normand Sloop consulted by the office, with plan made to admit today labor care.  Denies leaking or bleeding, reports +FM.  Cervix has been 2-3 cm in the office.  Patient Active Problem List  Diagnosis  . ASTHMA NOS W/ACUTE EXACERBATION  . GERD  . HEADACHE  . CARDIOMEGALY, MILD  . Sickle cell trait  . LSIL (low grade squamous intraepithelial lesion) on Pap smear  . Persistent dry cough  . SVT (supraventricular tachycardia)  . Normal pregnancy  Hx SVT--initial episode during 2007 pregnancy, with cardiac evaluation.  That episode resolved, with no recent episodes and no additional evaluation. Had CXR in 2011 with primary MD during evaluation for cough--xray showed mild cardiomegaly, but primary MD felt heart size was related to body habitus.  No further evaluation.  History of present pregnancy: Patient entered care at 55 6/7 weeks.   EDC of 05/03/12 was established by LMP   Anatomy scan:  22 1/7 weeks, with normal findings and an posterior placenta.   Additional [place] evaluations:  36 weeks for EFW--6+5, 44%ile, vtx, normal fluid.   Significant prenatal events:  Seen in MAU 27 weeks for URI, treated with OTC cough med.  Declined colpo during pregnancy for LGSIL in 2/13.  Seen in MAU at 39 weeks for leaking--no SROM.  Desired social induction, with MD delivery preferred.  Last evaluation:  04/30/12, cervix 2.5 cm.  OB History    Grav Para Term Preterm Abortions TAB SAB Ect Mult Living   3 2 2       2     2006--SVB, 41 weeks, 3 hour labor, 5+12, female, epidural, with [facility]  2007--SVB, 41 weeks, 8 hours, 6+2, female, epidural, with [facility] clinic.  +GBS and oligo.  Past Medical History  Diagnosis Date  . SVT (supraventricular tachycardia)   . Abnormal Pap smear 06/2011    WAS TO HAVE  COLPO;HAS NOT HAD YET, HAD + UPT  . Infection     UTI NOT FREQUENT  . Headache     MIGRAINES;USUALLY SLEEPS  . GERD (gastroesophageal reflux disease)     NOT FREQUENT, HAS MORE WITH PREGNANCY  . Fibroid    Past Surgical History  Procedure Date  . No past surgeries   . Hysteroscopy 06/28/2011    IN OFFICE IUD REMOVAL WITH [facility]  . Hysteroscopy    Family History: family history includes Asthma in her brother, maternal aunt, mother, and sister; Cancer in her maternal aunts and paternal grandfather; Cholelithiasis in her mother; Heart attack in her maternal grandmother; Heart disease in her maternal aunt; Hypertension in her maternal aunt, maternal grandmother, and maternal uncle; Hyperthyroidism in her paternal grandmother; and Migraines in her mother.  There is no history of Anesthesia problems.  Social History:  reports that she has never smoked. She has never used smokeless tobacco. She reports that she does not drink alcohol or use illicit drugs.  Husband, Felipa Eth, is involved and supportive.  Patient is employed with [employer].   Prenatal Transfer Tool  Maternal Diabetes: No Genetic Screening: Declined Maternal Ultrasounds/Referrals: Normal Fetal Ultrasounds or other Referrals:  None Maternal Substance Abuse:  No Significant Maternal Medications:  None Significant Maternal Lab Results: Lab values include: Group B Strep negative    ROS:  Irregular contractions, +FM  No  Known Allergies     Last menstrual period 07/28/2011.  Chest clear Heart RRR without murmur Abd gravid, NT, FH 39 cm Pelvic: 2-3 cm, 60%, vtx, -1/-2, IBOW Ext: WNL  FHR: Category 1 UCs:  q 5-6 min.  Prenatal labs: ABO, Rh: B/POS/-- (05/01 1017) Antibody: NEG (05/01 1017) Rubella:   Immune RPR: NON REAC (09/12 1030)  HBsAg: NEGATIVE (05/01 1017)  HIV: NON REACTIVE (05/01 1017)  GBS: NEGATIVE (11/13 4098) Sickle cell/Hgb electrophoresis:  Positive Pap:  06/2011 LGSIL, declined colpo during  pregnancy GC:  Negative at 28 weeks Chlamydia:  Negative at 28 weeks Genetic screenings:  Declined Glucola:  WNL Other:  Hgb 13.1 at NOB, 11 at 28 weeks       Assessment/Plan: IUP at 40 2/7 weeks Early labor GBS negative Requests MD delivery  Plan: Admit to [facility] per consult with Dr. Normand Sloop.  I will assist in the patient's care as needed--patient is agreeable with that plan. Routine [facility] orders Planning to decline epidural--may want IV meds. Plan AROM and augmentation prn.  Nigel Bridgeman, CNM, [place] 05/05/2012, 9:37 AM

## 2012-05-06 LAB — CBC
HCT: 30.9 % — ABNORMAL LOW (ref 36.0–46.0)
MCH: 24 pg — ABNORMAL LOW (ref 26.0–34.0)
MCHC: 33.3 g/dL (ref 30.0–36.0)
RDW: 16.2 % — ABNORMAL HIGH (ref 11.5–15.5)

## 2012-05-06 NOTE — Progress Notes (Signed)
Patient ID: Brianna Brooks, female   DOB: Oct 06, 1985, 26 y.o.   MRN: [id number] Post Partum Day 1 NSVD without complications female  Subjective: no complaints, up ad lib without syncope, voiding, tolerating PO, + flatus, Pain well controlled with po meds, taking motrin and percocet  BF initiated  Mood stable, bonding well, many family members supportive at bs Contraception: undecided    Objective: Blood pressure 126/68, pulse 82, temperature 98.4 F (36.9 C), temperature source Oral, resp. rate 17, height 5\' 7"  (1.702 m), weight 229 lb (103.874 kg), last menstrual period 07/28/2011, SpO2 98.00%, unknown if currently breastfeeding.  Physical Exam:  General: no distress Lungs: CTAB Heart: RRR Lochia: appropriate Uterine Fundus: firm Perineum: WNL DVT Evaluation: No evidence of DVT seen on physical exam. Negative Homan's sign. No significant calf/ankle edema.   Basename 05/06/12 0532 05/05/12 0950  HGB 10.3* 11.2*  HCT 30.9* 33.9*    Assessment/Plan: Plan for discharge tomorrow, Breastfeeding and Lactation consult Mild PP anemia Continue current care      LOS: 1 day   Brianna Brooks M 05/06/2012, 3:24 PM

## 2012-05-07 MED ORDER — IBUPROFEN 800 MG PO TABS: 800.0000 mg | ORAL_TABLET | Freq: Three times a day (TID) | ORAL | Status: DC | PRN

## 2012-05-07 MED ORDER — FERROUS SULFATE 325 (65 FE) MG PO TABS: 325.0000 mg | ORAL_TABLET | Freq: Three times a day (TID) | ORAL | Status: DC

## 2012-05-07 MED ORDER — OXYCODONE-ACETAMINOPHEN 5-325 MG PO TABS: 1.0000 | ORAL_TABLET | ORAL | Status: DC | PRN

## 2012-05-07 NOTE — Discharge Summary (Signed)
  Vaginal Delivery Discharge Summary  Brianna Brooks  DOB:    November 02, 1985 MRN:    [id number] CSN:    [id number]  Date of admission:                  05/05/2012  Date of discharge:                   05/07/2012  Procedures this admission:  05/05/2012  Normal spontaneous vaginal delivery by Nigel Bridgeman certified nurse midwife  Newborn Data:  Live born female  Birth Weight: 8 lb 0.8 oz (3650 g) APGAR: 8, 9  Home with mother. Name: Brianna Brooks  History of Present Illness:  Ms. Brianna Brooks is a 26 y.o. female, G3P3003, who presents at [redacted]w[redacted]d weeks gestation. The patient has been followed at the [facility] and Gynecology division of [facility]. She was admitted onset of labor. Her pregnancy has been complicated by: A history of sinus ventricular tachycardia. She was evaluated and found to have a slightly large heart but this was thought to be consistent with pregnancy and normal variation. She has sickle cell trait.  Hospital course:  The patient was admitted for early labor.   Her labor was not complicated. She proceeded to have a vaginal delivery of a healthy infant. Her delivery was not complicated. Her postpartum course was not complicated. She was discharged to home on postpartum day 2 doing well.  Feeding:  breast  Contraception:  Mirena IUD  Discharge hemoglobin:  Hemoglobin  Date Value Range Status  05/06/2012 10.3* 12.0 - 15.0 g/dL Final     HCT  Date Value Range Status  05/06/2012 30.9* 36.0 - 46.0 % Final    Discharge Physical Exam:   General: alert and no distress Lochia: appropriate Uterine Fundus: firm Incision: Not applicable DVT Evaluation: No evidence of DVT seen on physical exam.  Intrapartum Procedures: spontaneous vaginal delivery Postpartum Procedures: none Complications-Operative and Postpartum: none  Discharge Diagnoses: Term Pregnancy-delivered and Anemia, sickle cell trait, sinus ventricular  tachycardia  Discharge Information:  Activity:           pelvic rest Diet:                routine Medications: PNV, Ibuprofen, Iron and Percocet Condition:      stable and improved Instructions:  See computer instructions Discharge to: home  Follow-up Information    Follow up with [facility]. In 6 weeks.   Contact information:   [address], [address] [place] [address]           Janine Limbo 05/07/2012

## 2012-06-10 ENCOUNTER — Encounter: Payer: Self-pay | Admitting: Obstetrics and Gynecology

## 2012-06-10 ENCOUNTER — Ambulatory Visit: Payer: 59 | Admitting: Obstetrics and Gynecology

## 2012-06-10 DIAGNOSIS — IMO0001 Reserved for inherently not codable concepts without codable children: Secondary | ICD-10-CM

## 2012-06-10 NOTE — Progress Notes (Signed)
Brianna Brooks is a 27 y.o. female who presents for a postpartum visit.   Type of delivery:  SVD, no lac, Delivery with VL  Patient reports she is doing well.  Hx remarkable for: Patient Active Problem List  Diagnosis  . ASTHMA NOS W/ACUTE EXACERBATION  . GERD  . HEADACHE  . CARDIOMEGALY, MILD  . Sickle cell trait  . Persistent dry cough  . Normal pregnancy     PPDS = 0, pt states no feelings of depression  Contraception plan:  Mirena IUD, planning to make appt for insertion asap   I have fully reviewed the prenatal and intrapartum course   Patient has not been sexually active since delivery.   The following portions of the patient's history were reviewed and updated as appropriate: allergies, current medications, past family history, past medical history, past social history, past surgical history and problem list.  Review of Systems Pertinent items are noted in HPI.   Objective:    BP 100/70  Temp 97.6 F (36.4 C)  Ht 5\' 7"  (1.702 m)  Wt 209 lb (94.802 kg)  BMI 32.73 kg/m2  General:  alert, cooperative and no distress     Lungs: clear to auscultation bilaterally  Heart:  regular rate and rhythm, S1, S2 normal, no murmur  Abdomen: soft, non-tender; bowel sounds normal; no masses,  no organomegaly.   Incision:  none   Vulva:  normal  Vagina: normal vagina  Cervix:  normal  Uterus: normal size, contour, position, consistency, mobility, non-tender, well-involuted  Adnexa:  normal adnexa          Wet Prep: neg Cultures: Sent   Assessment:     Normal postpartum exam.  Pap smear:   not done at today's visit. Last pap showed LSIL - colpo was delayed d/t pregnancy.  Pt will schedule colpo asap. Contraception: Mirena, will schedule for insertion  Plan:  Follow-up asap for Mirena insertion and colpo. Priority is to place the IUD first  Rxs: none  Nigel Bridgeman CNM, [place] 06/10/2012 10:25 AM

## 2012-06-10 NOTE — Addendum Note (Signed)
Addended by: Janeece Agee on: 06/10/2012 11:19 AM   Modules accepted: Orders

## 2012-06-10 NOTE — Progress Notes (Signed)
Date of delivery: 05/05/2012 Female Name: Brianna Brooks Vaginal delivery:yes Cesarean section:no Tubal ligation:no GDM:no Breast Feeding:yes Bottle Feeding:yes Post-Partum Blues:no Abnormal pap:yes Normal GU function: yes Normal GI function:yes Returning to work:no EPDS: Score--0

## 2012-06-11 LAB — GC/CHLAMYDIA PROBE AMP: GC Probe RNA: NEGATIVE

## 2012-06-23 IMAGING — CR DG CHEST 2V
2 series · 2 of 2 positions shown · non-contrast
Comparison: 11/30/2006

CLINICAL DATA: Cough/short of breath

CHEST - 2 VIEW

[view not recorded (1 of 2)]
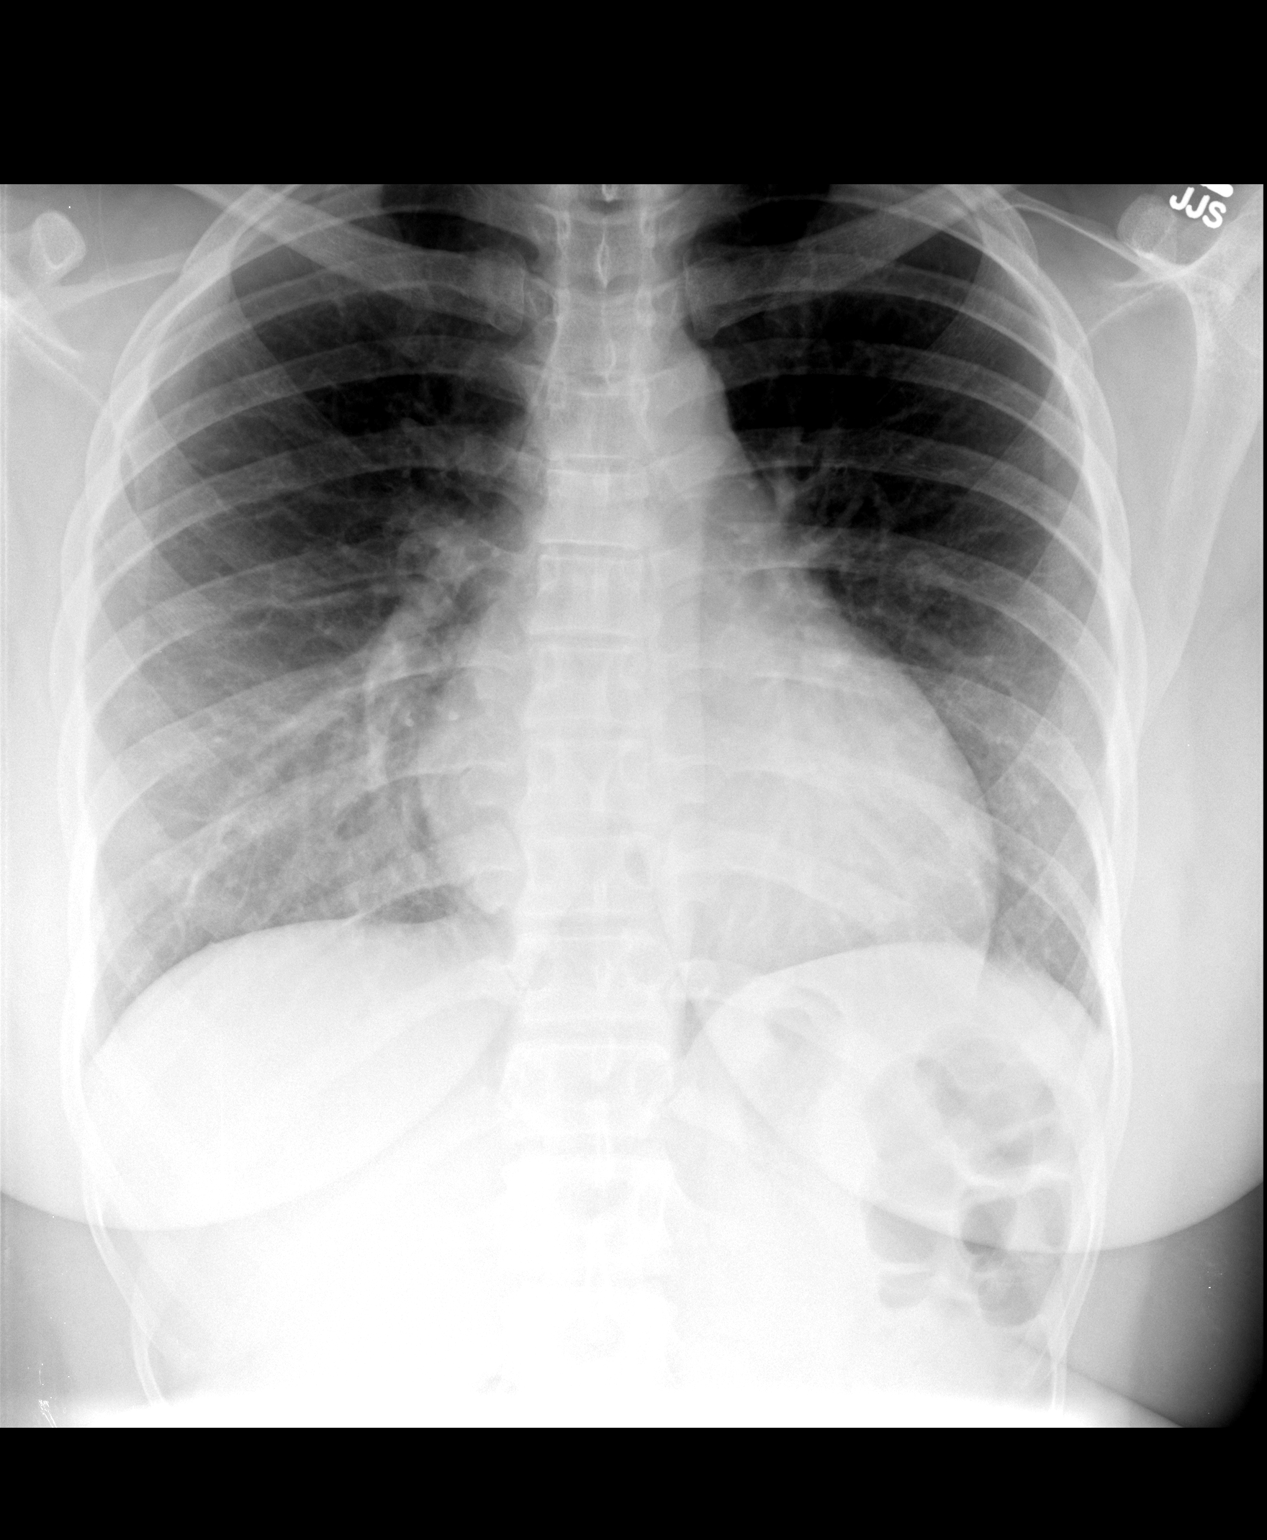

[view not recorded (2 of 2)]
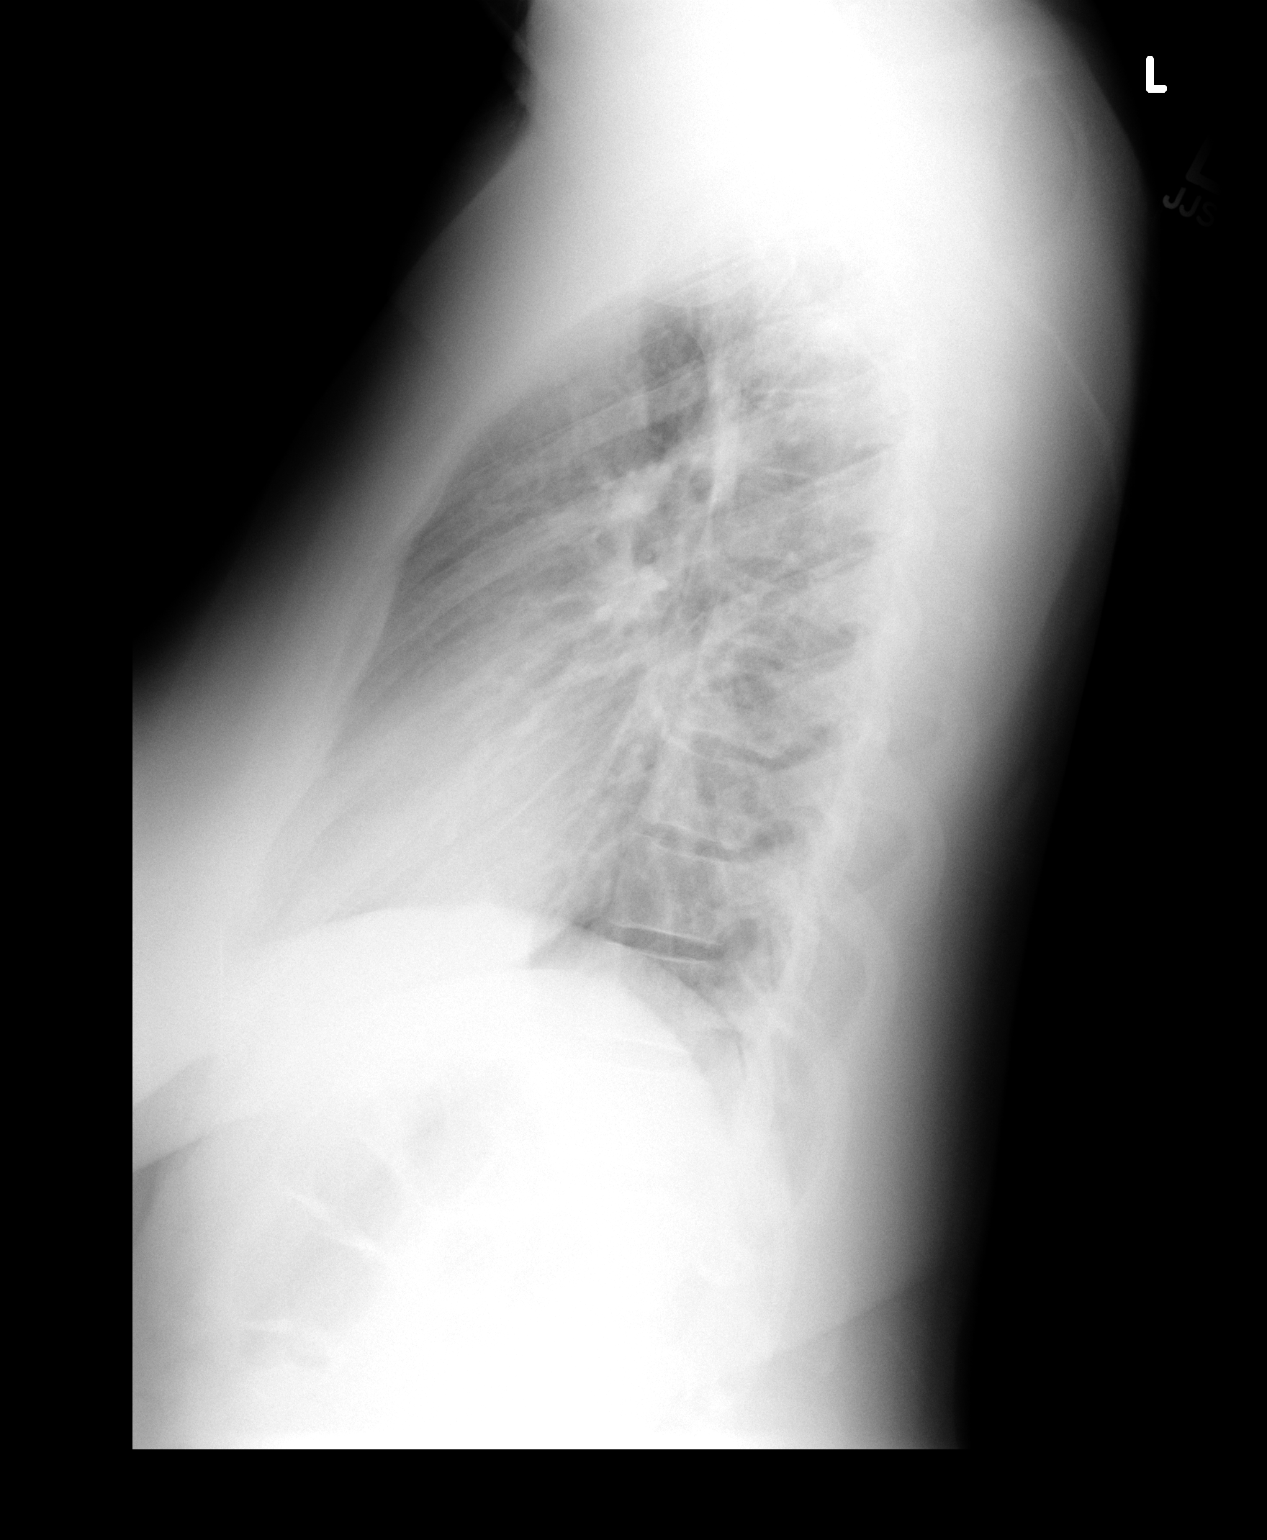

[2 of 2 positions shown; findings below may reference images not displayed]

FINDINGS: The heart is mildly enlarged.  This is a new finding.
There is no vascular congestion or pleural fluid.  Lungs clear.
Osseous structures intact.
IMPRESSION: Mild cardiomegaly - new finding.  Otherwise normal.

## 2012-06-24 ENCOUNTER — Ambulatory Visit: Payer: 59 | Admitting: Obstetrics and Gynecology

## 2012-06-24 ENCOUNTER — Encounter: Payer: Self-pay | Admitting: Obstetrics and Gynecology

## 2012-06-24 VITALS — BP 110/80 | HR 78 | Wt 214.0 lb

## 2012-06-24 DIAGNOSIS — IMO0001 Reserved for inherently not codable concepts without codable children: Secondary | ICD-10-CM

## 2012-06-24 DIAGNOSIS — Z30430 Encounter for insertion of intrauterine contraceptive device: Secondary | ICD-10-CM

## 2012-06-24 MED ORDER — LEVONORGESTREL 20 MCG/24HR IU IUD
INTRAUTERINE_SYSTEM | Freq: Once | INTRAUTERINE | Status: AC
  Administered 2012-06-24: 1 via INTRAUTERINE

## 2012-06-24 NOTE — Patient Instructions (Signed)
Call [facility] [phone]:  -for temperature of 100.4 degrees Fahrenheit or more -pain not improved with over the counter pain medications (Ibuprofen, Advil, Aleve,        Tylenol or acetaminophen) -for excessive bleeding (more than a usual period) -for any other concerns  Do not place anything in your vagina for the next 7 days

## 2012-06-24 NOTE — Progress Notes (Signed)
IUD INSERTION NOTE  Brianna Brooks is a 27 y.o. female [phone] who presents for IUD insertion.  Consent signed after risks and benefits were reviewed including but not limited to bleeding, infection, expulsion and risk of uterine perforation that may require an additional procedure for removal.  LMP: No LMP recorded. Patient is not currently having periods (Reason: Lactating). UPT: negative  MIRENA LOT NUMBER: [id number]  Uterus assessed for size and position Prepped with Betadine Tenaculum placed on anterior lip of cervix after Hurricane gel was applied Uterus sounded at  8.5  cm Insertion of MIRENA IUD per protocol without any complications Strings trimmed   Assessment:  IUD Insertion  Plan:  1. Patient instructed to call with oral temperature of 100.4 degrees Fahrenheit or more, excessive bleeding or pain that is not relieved with OTC analgesia taken as directed  2. Patient instructed on how  to check IUD strings and encouraged to do so after each menstrual cycle  3. Advised not to place anything in vagina or have sexual intercourse for 7 days  4. Follow-up: 4  weeks   Blayne Frankie PA-C 06/24/2012 10:36 AM

## 2012-07-22 ENCOUNTER — Encounter: Payer: 59 | Admitting: Obstetrics and Gynecology

## 2013-06-03 ENCOUNTER — Ambulatory Visit: Payer: 59 | Admitting: Family Medicine

## 2013-06-03 VITALS — BP 132/80 | HR 81 | Temp 100.4°F | Resp 16 | Wt 201.6 lb

## 2013-06-03 DIAGNOSIS — R509 Fever, unspecified: Secondary | ICD-10-CM

## 2013-06-03 DIAGNOSIS — J111 Influenza due to unidentified influenza virus with other respiratory manifestations: Secondary | ICD-10-CM

## 2013-06-03 DIAGNOSIS — R52 Pain, unspecified: Secondary | ICD-10-CM

## 2013-06-03 LAB — POCT INFLUENZA A/B
INFLUENZA A, POC: NEGATIVE
INFLUENZA B, POC: NEGATIVE

## 2013-06-03 MED ORDER — OSELTAMIVIR PHOSPHATE 75 MG PO CAPS: 75.0000 mg | ORAL_CAPSULE | Freq: Two times a day (BID) | ORAL | Status: DC

## 2013-06-03 NOTE — Progress Notes (Signed)
Subjective:    Patient ID: Brianna Brooks, female    DOB: 07/30/1985, 28 y.o.   MRN: [id number]  This chart was scribed for Wendie Agreste, MD by Maree Erie, ED Scribe. The patient was seen in room 12. Patient's care was started at 8:52 PM.  Chief Complaint  Patient presents with  . Generalized Body Aches    yesterday  . Sinusitis  . Headache  . Cough  . Fever   PCP: Scarlette Calico, MD  HPI  Brianna Brooks is a 28 y.o. female who presents to office complaining of an acute onset sore throat that began yesterday morning. She has associated cough, myalgias, lightheadedness and subjective fever. She denies taking any medication for the symptoms. She states that her one year old son has recently been sick with cough and congestion. She denies he has had a fever. He is not in daycare. She denies any other sick contacts with fever. She denies receiving a flu vaccination this season. She has a history of sickle cell trait, but she denies that she has ever had a crisis or had to take medication for it. She lives at home with her husband and two children.    Patient Active Problem List   Diagnosis Date Noted  . Normal pregnancy 03/12/2012  . Persistent dry cough 02/14/2012  . Sickle cell trait 09/22/2011  . 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 ASTHMA NOS W/ACUTE EXACERBATION 04/18/2010  . GERD 04/18/2010  . HEADACHE 04/18/2010   Past Medical History  Diagnosis Date  . SVT (supraventricular tachycardia)   . Abnormal Pap smear 06/2011    WAS TO HAVE COLPO;HAS NOT HAD YET, HAD + UPT  . Infection     UTI NOT FREQUENT  . Headache     MIGRAINES;USUALLY SLEEPS  . GERD (gastroesophageal reflux disease)     NOT FREQUENT, HAS MORE WITH PREGNANCY  . Fibroid    Past Surgical History  Procedure Laterality Date  . No past surgeries    . Hysteroscopy  06/28/2011    IN OFFICE IUD REMOVAL WITH [facility]  . Hysteroscopy     No Known Allergies Prior to Admission medications   Not on File   History     Social History  . Marital Status: Married    Spouse Name: TERRENCE Mohammad    Number of Children: 2  . Years of Education: 12.5   Occupational History  . [employer]   Social History Main Topics  . Smoking status: Never Smoker   . Smokeless tobacco: Never Used  . Alcohol Use: No  . Drug Use: No  . Sexual Activity: Yes    Partners: Male    Birth Control/ Protection: None     Comment: PT STATES NO UNPROTECTED I/C X 14 DAYS   Other Topics Concern  . Not on file   Social History Narrative  . No narrative on file    Review of Systems  Constitutional: Positive for fever.  HENT: Positive for sore throat. Negative for drooling and ear pain.   Eyes: Negative for discharge.  Respiratory: Positive for cough.   Cardiovascular: Negative for leg swelling.  Gastrointestinal: Negative for vomiting.  Endocrine: Negative for polyuria.  Genitourinary: Negative for hematuria.  Musculoskeletal: Positive for myalgias. Negative for gait problem.  Skin: Negative for rash.  Allergic/Immunologic: Negative for immunocompromised state.  Neurological: Positive for light-headedness. Negative for speech difficulty.  Hematological: Negative for adenopathy.  Psychiatric/Behavioral: Negative for confusion.  Objective:   Physical Exam  Vitals reviewed. Constitutional: She is oriented to person, place, and time. She appears well-developed and well-nourished. No distress.  HENT:  Head: Normocephalic and atraumatic.  Right Ear: Hearing, tympanic membrane, external ear and ear canal normal.  Left Ear: Hearing, tympanic membrane, external ear and ear canal normal.  Nose: Nose normal.  Mouth/Throat: Oropharynx is clear and moist. No oropharyngeal exudate or posterior oropharyngeal erythema.  Diffuse sinus tenderness.   Eyes: Conjunctivae and EOM are normal. Pupils are equal, round, and reactive to light.  Cardiovascular: Normal rate, regular rhythm, normal heart sounds and  intact distal pulses.   No murmur heard. Pulmonary/Chest: Effort normal and breath sounds normal. No respiratory distress. She has no wheezes. She has no rhonchi.  Neurological: She is alert and oriented to person, place, and time.  Skin: Skin is warm and dry. No rash noted.  Psychiatric: She has a normal mood and affect. Her behavior is normal.   Filed Vitals:   06/03/13 2005  BP: 132/80  Pulse: 81  Temp: 100.4 F (38 C)  TempSrc: Oral  Resp: 16  Weight: 201 lb 9.6 oz (91.445 kg)  SpO2: 98%  PF: 67 L/min   Results for orders placed in visit on 06/03/13  POCT INFLUENZA A/B      Result Value Range   Influenza A, POC Negative     Influenza B, POC Negative          Assessment & Plan:   Brianna Brooks is a 28 y.o. female Body aches - Plan: POCT Influenza A/B, oseltamivir (TAMIFLU) 75 MG capsule  Fever - Plan: POCT Influenza A/B, oseltamivir (TAMIFLU) 75 MG capsule  Influenza - Plan: oseltamivir (TAMIFLU) 75 MG capsule  Influenza like illness, and suspected flu even with negative rapid flu testing.  sx care as below, Rx Tamiflu given if she wants to start this, and discussed CBC for further eval tonight, but declined at present. Agreed to RTC or ER if dyspnea, worsening symptoms, or not improving in next 5 days.    Meds ordered this encounter  Medications  . oseltamivir (TAMIFLU) 75 MG capsule    Sig: Take 1 capsule (75 mg total) by mouth 2 (two) times daily.    Dispense:  10 capsule    Refill:  0   Patient Instructions  Avoid contact as much as possible with any fever and you are contagious until 24 hours after fever has resolved without medication.  Even though your flu test was negative, this appears to be flu, or influenza like illness, so can take Tamiflu if you would like. However, if any increased symptoms, including difficulty breathing, worsening cough or not improving in the next four to five days, recommend recheck. Return to the clinic or go to the nearest  emergency room if any of your symptoms worsen or new symptoms occur.  Influenza, Adult Influenza ("the flu") is a viral infection of the respiratory tract. It occurs more often in winter months because people spend more time in close contact with one another. Influenza can make you feel very sick. Influenza easily spreads from person to person (contagious). CAUSES  Influenza is caused by a virus that infects the respiratory tract. You can catch the virus by breathing in droplets from an infected person's cough or sneeze. You can also catch the virus by touching something that was recently contaminated with the virus and then touching your mouth, nose, or eyes. SYMPTOMS  Symptoms typically last 4 to 10  days and may include:  Fever.  Chills.  Headache, body aches, and muscle aches.  Sore throat.  Chest discomfort and cough.  Poor appetite.  Weakness or feeling tired.  Dizziness.  Nausea or vomiting. DIAGNOSIS  Diagnosis of influenza is often made based on your history and a physical exam. A nose or throat swab test can be done to confirm the diagnosis. RISKS AND COMPLICATIONS You may be at risk for a more severe case of influenza if you smoke cigarettes, have diabetes, have chronic heart disease (such as heart failure) or lung disease (such as asthma), or if you have a weakened immune system. Elderly people and pregnant women are also at risk for more serious infections. The most common complication of influenza is a lung infection (pneumonia). Sometimes, this complication can require emergency medical care and may be life-threatening. PREVENTION  An annual influenza vaccination (flu shot) is the best way to avoid getting influenza. An annual flu shot is now routinely recommended for all adults in the [place] TREATMENT  In mild cases, influenza goes away on its own. Treatment is directed at relieving symptoms. For more severe cases, your caregiver may prescribe antiviral medicines to  shorten the sickness. Antibiotic medicines are not effective, because the infection is caused by a virus, not by bacteria. HOME CARE INSTRUCTIONS  Only take over-the-counter or prescription medicines for pain, discomfort, or fever as directed by your caregiver.  Use a cool mist humidifier to make breathing easier.  Get plenty of rest until your temperature returns to normal. This usually takes 3 to 4 days.  Drink enough fluids to keep your urine clear or pale yellow.  Cover your mouth and nose when coughing or sneezing, and wash your hands well to avoid spreading the virus.  Stay home from work or school until your fever has been gone for at least 1 full day. SEEK MEDICAL CARE IF:   You have chest pain or a deep cough that worsens or produces more mucus.  You have nausea, vomiting, or diarrhea. SEEK IMMEDIATE MEDICAL CARE IF:   You have difficulty breathing, shortness of breath, or your skin or nails turn bluish.  You have severe neck pain or stiffness.  You have a severe headache, facial pain, or earache.  You have a worsening or recurring fever.  You have nausea or vomiting that cannot be controlled. MAKE SURE YOU:  Understand these instructions.  Will watch your condition.  Will get help right away if you are not doing well or get worse. Document Released: 05/03/2000 Document Revised: 11/05/2011 Document Reviewed: 08/05/2011 [employer] Patient Information 2014 [place], [place].     I personally performed the services described in this documentation, which was scribed in my presence. The recorded information has been reviewed and considered, and addended by me as needed.

## 2013-06-03 NOTE — Patient Instructions (Addendum)
Avoid contact as much as possible with any fever and you are contagious until 24 hours after fever has resolved without medication.  Even though your flu test was negative, this appears to be flu, or influenza like illness, so can take Tamiflu if you would like. However, if any increased symptoms, including difficulty breathing, worsening cough or not improving in the next four to five days, recommend recheck. Return to the clinic or go to the nearest emergency room if any of your symptoms worsen or new symptoms occur.  Influenza, Adult Influenza ("the flu") is a viral infection of the respiratory tract. It occurs more often in winter months because people spend more time in close contact with one another. Influenza can make you feel very sick. Influenza easily spreads from person to person (contagious). CAUSES  Influenza is caused by a virus that infects the respiratory tract. You can catch the virus by breathing in droplets from an infected person's cough or sneeze. You can also catch the virus by touching something that was recently contaminated with the virus and then touching your mouth, nose, or eyes. SYMPTOMS  Symptoms typically last 4 to 10 days and may include:  Fever.  Chills.  Headache, body aches, and muscle aches.  Sore throat.  Chest discomfort and cough.  Poor appetite.  Weakness or feeling tired.  Dizziness.  Nausea or vomiting. DIAGNOSIS  Diagnosis of influenza is often made based on your history and a physical exam. A nose or throat swab test can be done to confirm the diagnosis. RISKS AND COMPLICATIONS You may be at risk for a more severe case of influenza if you smoke cigarettes, have diabetes, have chronic heart disease (such as heart failure) or lung disease (such as asthma), or if you have a weakened immune system. Elderly people and pregnant women are also at risk for more serious infections. The most common complication of influenza is a lung infection  (pneumonia). Sometimes, this complication can require emergency medical care and may be life-threatening. PREVENTION  An annual influenza vaccination (flu shot) is the best way to avoid getting influenza. An annual flu shot is now routinely recommended for all adults in the [place] TREATMENT  In mild cases, influenza goes away on its own. Treatment is directed at relieving symptoms. For more severe cases, your caregiver may prescribe antiviral medicines to shorten the sickness. Antibiotic medicines are not effective, because the infection is caused by a virus, not by bacteria. HOME CARE INSTRUCTIONS  Only take over-the-counter or prescription medicines for pain, discomfort, or fever as directed by your caregiver.  Use a cool mist humidifier to make breathing easier.  Get plenty of rest until your temperature returns to normal. This usually takes 3 to 4 days.  Drink enough fluids to keep your urine clear or pale yellow.  Cover your mouth and nose when coughing or sneezing, and wash your hands well to avoid spreading the virus.  Stay home from work or school until your fever has been gone for at least 1 full day. SEEK MEDICAL CARE IF:   You have chest pain or a deep cough that worsens or produces more mucus.  You have nausea, vomiting, or diarrhea. SEEK IMMEDIATE MEDICAL CARE IF:   You have difficulty breathing, shortness of breath, or your skin or nails turn bluish.  You have severe neck pain or stiffness.  You have a severe headache, facial pain, or earache.  You have a worsening or recurring fever.  You have nausea or vomiting  that cannot be controlled. MAKE SURE YOU:  Understand these instructions.  Will watch your condition.  Will get help right away if you are not doing well or get worse. Document Released: 05/03/2000 Document Revised: 11/05/2011 Document Reviewed: 08/05/2011 [facility] Patient Information 2014 [place], [place].

## 2013-06-04 ENCOUNTER — Encounter: Payer: Self-pay | Admitting: Family Medicine

## 2013-11-12 IMAGING — US US OB COMP LESS 14 WK
1 series · 14 of 28 positions shown · non-contrast
Comparison: None.

CLINICAL DATA: Discharge and spotting.  Beta HCG is pending.
Estimated gestational age by LMP is 5 weeks 6 days.

OBSTETRIC <14 WK US AND TRANSVAGINAL OB US
TECHNIQUE: Both transabdominal and transvaginal ultrasound
examinations were performed for complete evaluation of the
gestation as well as the maternal uterus, adnexal regions, and
pelvic cul-de-sac.  Transvaginal technique was performed to assess
early pregnancy.

[Series 1: us ob comp less 14 wks · 14 of 47 slices shown]
[im 2/47]
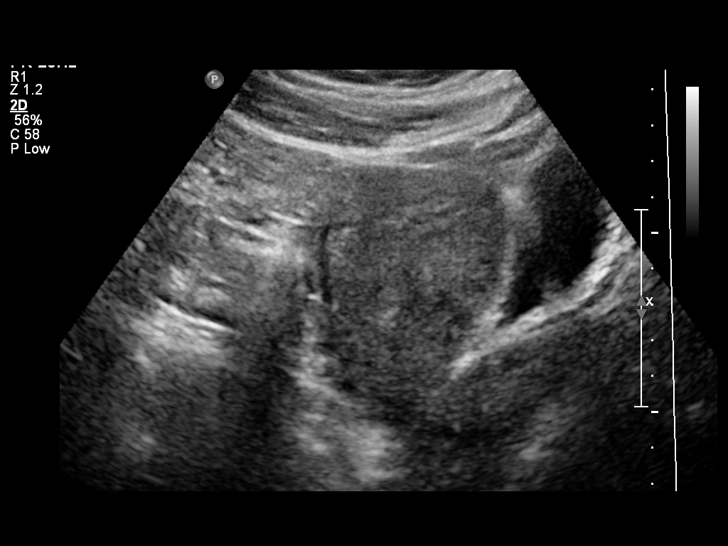
[im 6/47]
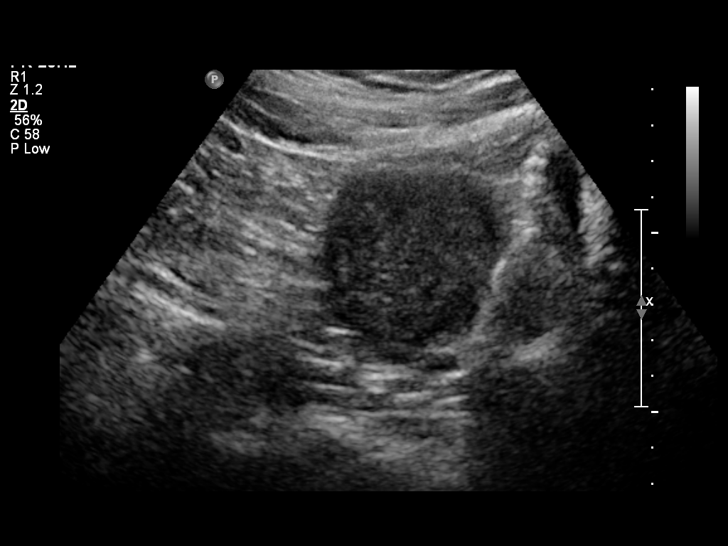
[im 9/47]
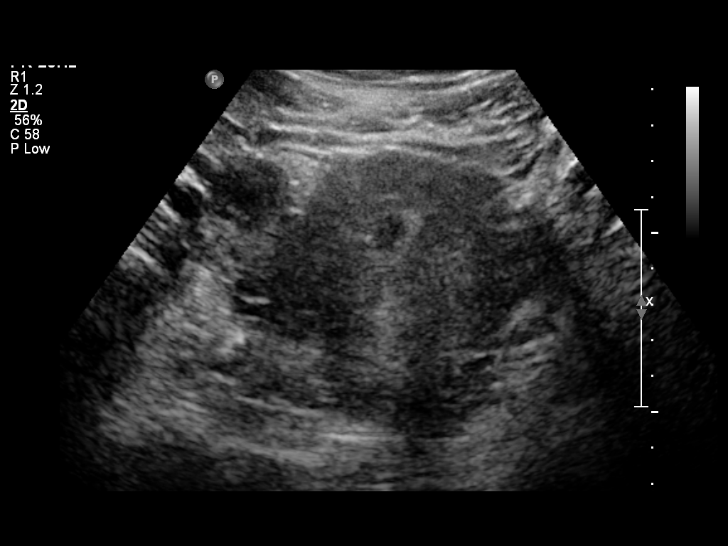
[im 12/47]
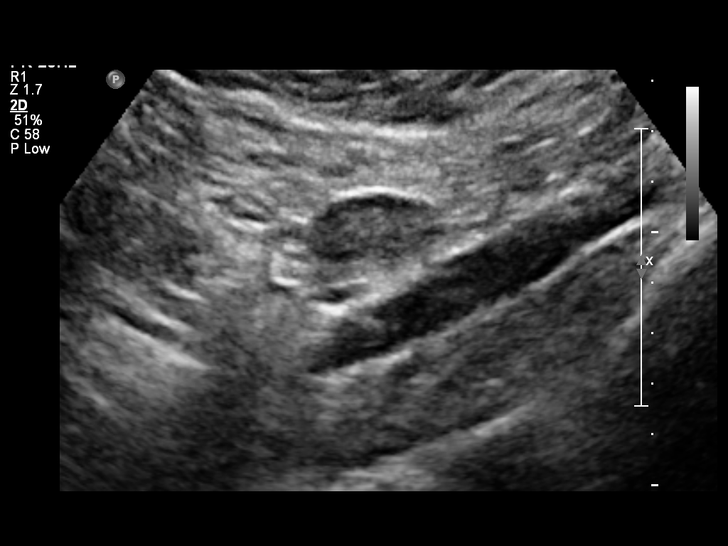
[im 16/47]
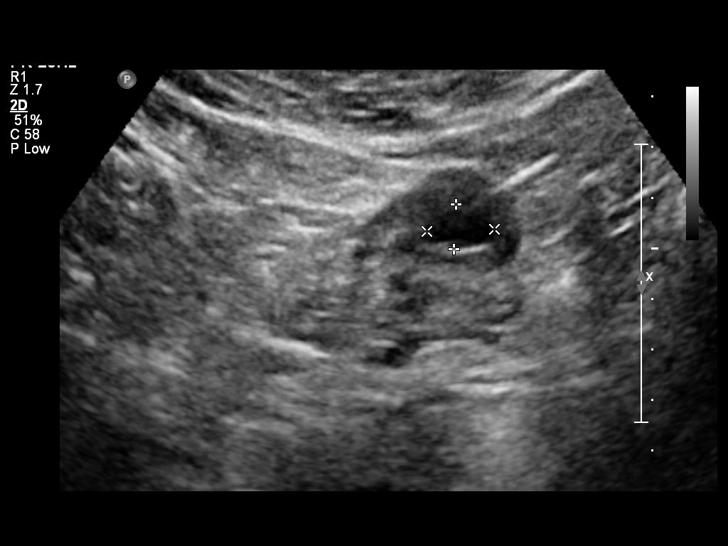
[im 19/47]
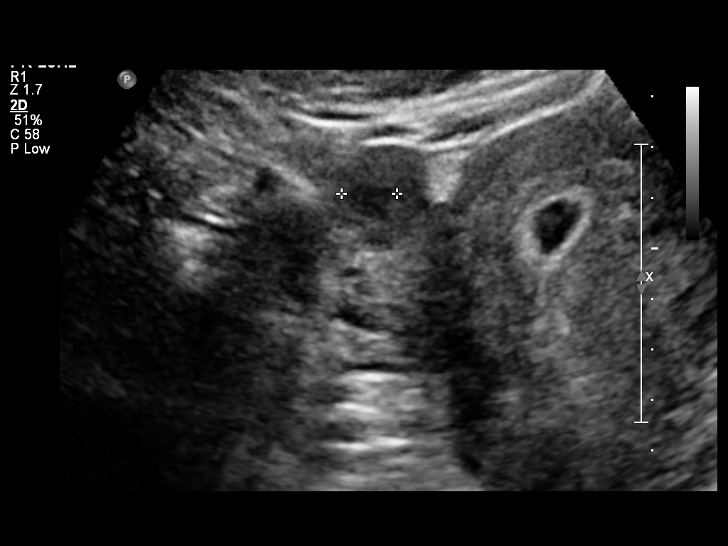
[im 23/47]
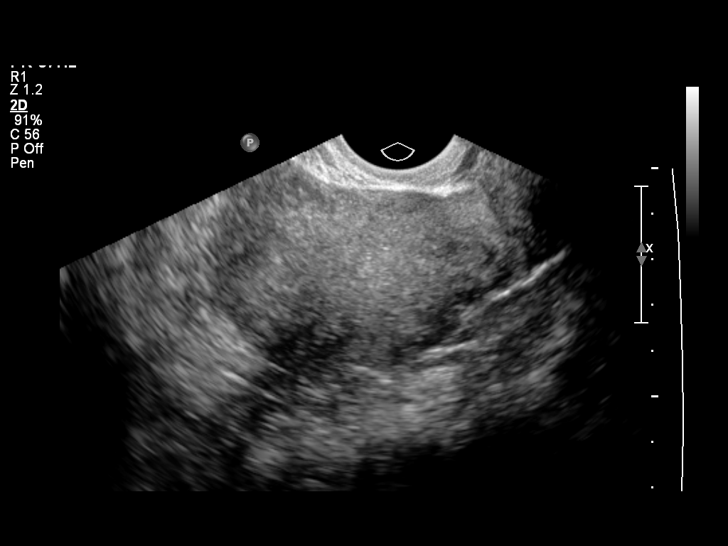
[im 26/47]
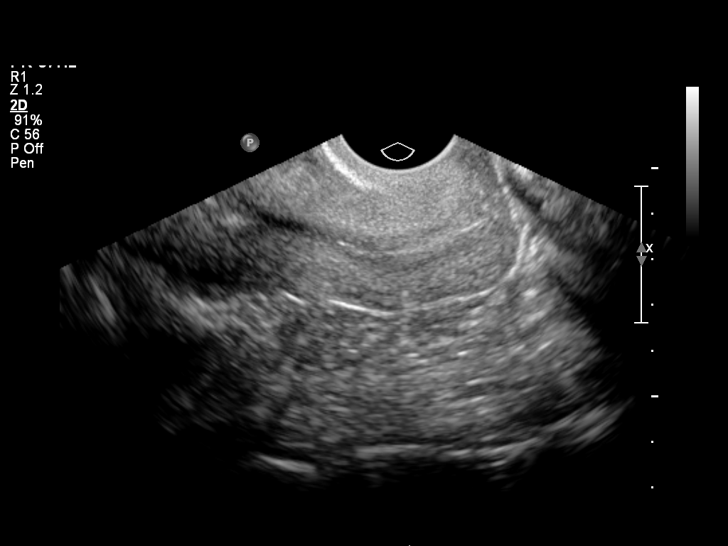
[im 29/47]
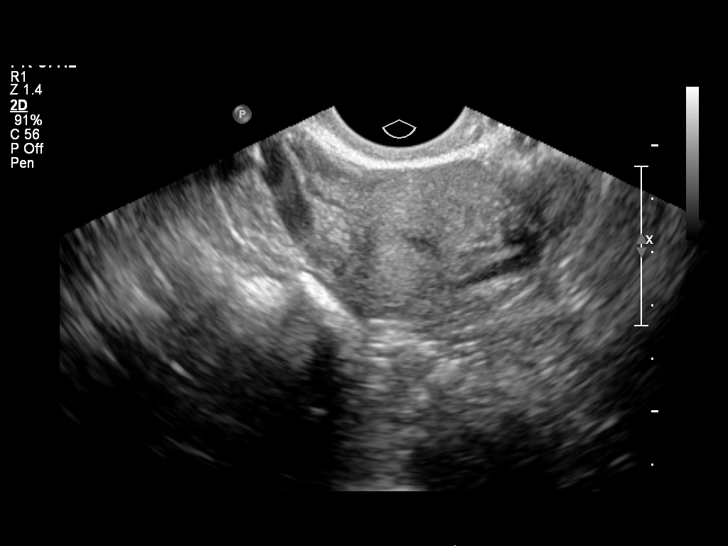
[im 33/47]
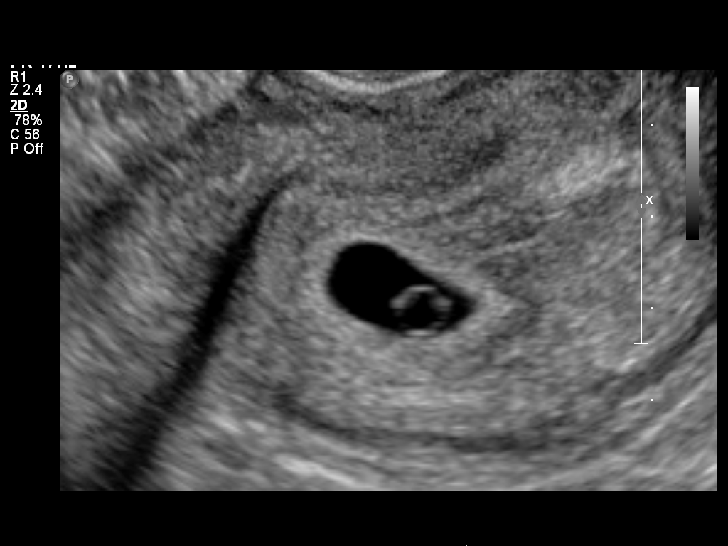
[im 36/47]
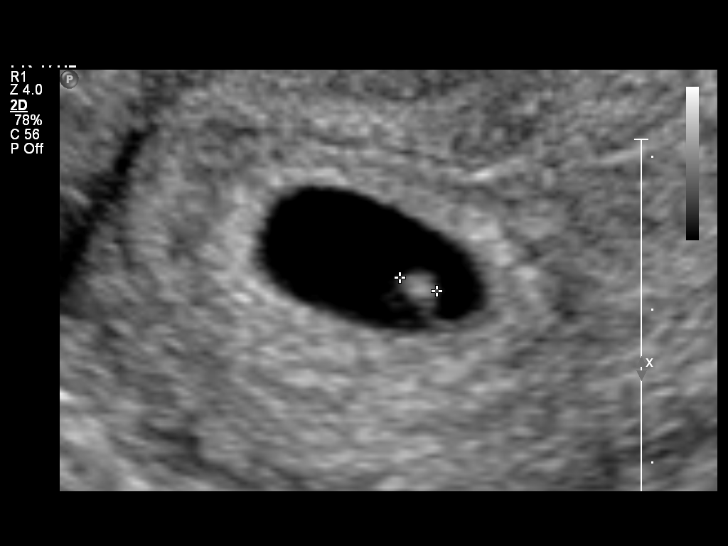
[im 40/47]
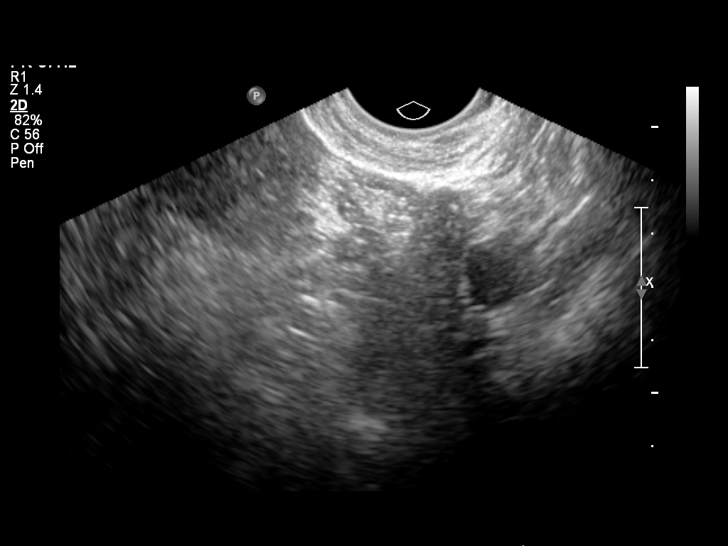
[im 43/47]
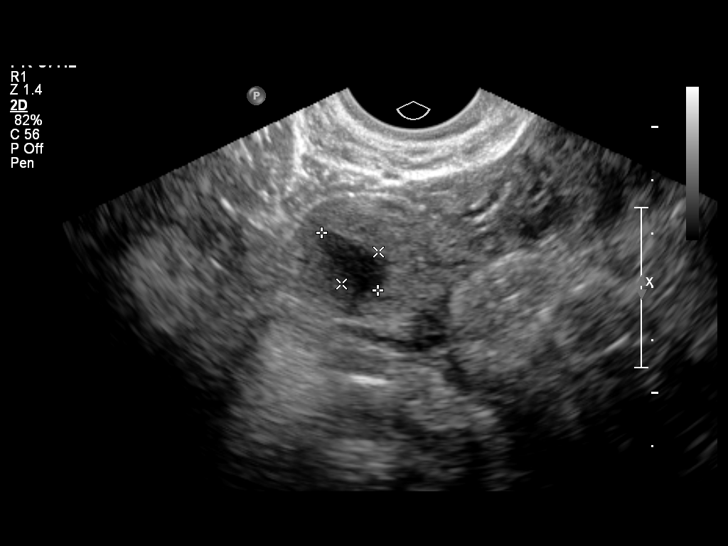
[im 47/47]
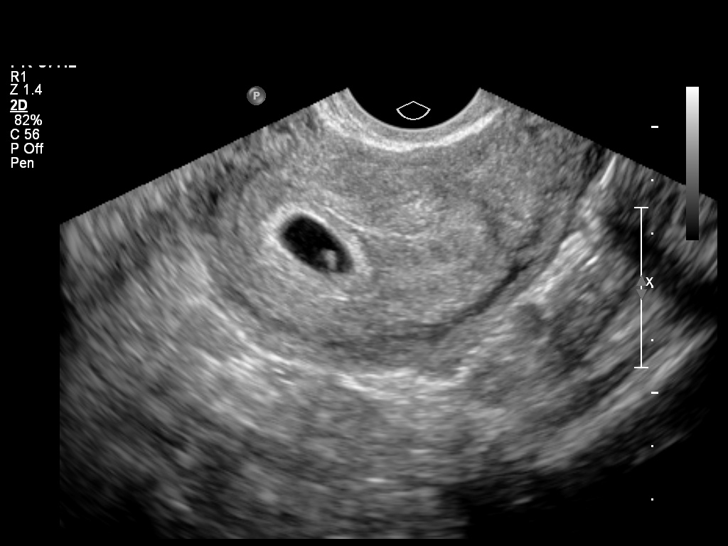

[14 of 28 positions shown; findings below may reference images not displayed]

Intrauterine gestational sac:  A single intrauterine pregnancy is
visualized.  No subchorionic hemorrhage.
Yolk sac: Present
Embryo: Present
Cardiac Activity: Demonstrated
Heart Rate: 101 bpm

CRL: 2.7   mm  5   w  6   d         US EDC: 05/03/2012

Maternal uterus/adnexae:
The uterus is anteverted with under distended bladder.  No
myometrial masses.  The right ovary measures 3.7 x 2.5 x
centimeters and contains a corpus luteum cyst.  The left ovary
measures 2.5 x 1.6 x 1.7 cm.  No abnormal adnexal mass lesions.  No
free pelvic fluid collections.
IMPRESSION: Single intrauterine pregnancy.  Estimated gestational age by crown-
rump length is 5 weeks 6 days.

## 2014-01-05 ENCOUNTER — Ambulatory Visit (INDEPENDENT_AMBULATORY_CARE_PROVIDER_SITE_OTHER): Payer: 59 | Admitting: Family Medicine

## 2014-01-05 VITALS — BP 120/72 | HR 117 | Temp 98.4°F | Resp 18 | Ht 67.5 in | Wt 218.0 lb

## 2014-01-05 DIAGNOSIS — R05 Cough: Secondary | ICD-10-CM

## 2014-01-05 DIAGNOSIS — R059 Cough, unspecified: Secondary | ICD-10-CM

## 2014-01-05 DIAGNOSIS — J9801 Acute bronchospasm: Secondary | ICD-10-CM | POA: Insufficient documentation

## 2014-01-05 MED ORDER — BENZONATATE 200 MG PO CAPS: 200.0000 mg | ORAL_CAPSULE | Freq: Three times a day (TID) | ORAL | Status: DC | PRN

## 2014-01-05 MED ORDER — FLUTICASONE PROPIONATE 50 MCG/ACT NA SUSP: 2.0000 | Freq: Every day | NASAL | Status: DC

## 2014-01-05 MED ORDER — ALBUTEROL SULFATE HFA 108 (90 BASE) MCG/ACT IN AERS: 2.0000 | INHALATION_SPRAY | RESPIRATORY_TRACT | Status: DC | PRN

## 2014-01-05 NOTE — Patient Instructions (Signed)
Treatment of persistent cough from suspected allergies: Flonase nasal spray Nasal Saline washes daily  Zyrtec or allegra daily can start with a Zyrtec-Decongestant for 2 weeks then continue Zyrtec or Allegra daily throughout fall until end of October Cough suppressant - Tessalon Perles use as needed  RETURN TO OFFICE IN 10 Day if no improvement RETURN TO OFFICE IN 4 week if improved for breathing and lung testing  Benzonatate capsules What is this medicine? BENZONATATE (ben ZOE na tate) is used to treat cough. This medicine may be used for other purposes; ask your health care provider or pharmacist if you have questions. COMMON BRAND NAME(S): Tessalon Perles, Zonatuss What should I tell my health care provider before I take this medicine? They need to know if you have any of these conditions: -kidney or liver disease -an unusual or allergic reaction to benzonatate, anesthetics, other medicines, foods, dyes, or preservatives -pregnant or trying to get pregnant -breast-feeding How should I use this medicine?  Fluticasone nasal spray (Flonase) What is this medicine? FLUTICASONE (floo TIK a sone) is a corticosteroid. This medicine is used to treat the symptoms of allergies like sneezing, itchy red eyes, and itchy, runny, or stuffy nose. This medicine may be used for other purposes; ask your health care provider or pharmacist if you have questions. COMMON BRAND NAME(S): Flonase, Flonase Allergy Relief, Veramyst What should I tell my health care provider before I take this medicine? They need to know if you have any of these conditions: -infection, like tuberculosis, herpes, or fungal infection -recent surgery on nose or sinuses -taking corticosteroid by mouth -an unusual or allergic reaction to fluticasone, steroids, other medicines, foods, dyes, or preservatives -pregnant or trying to get pregnant -breast-feeding How should I use this medicine? This medicine is for use in the nose.  Follow the directions on your product or prescription label. This medicine works best if used at regular intervals. Do not use more often than directed. Make sure that you are using your nasal spray correctly. After 6 months of daily use without a prescription, talk to your doctor or health care professional before using it for a longer time. Ask your doctor or health care professional if you have any questions. Talk to your pediatrician regarding the use of this medicine in children. While this drug may be used for children as young as 46 years old for selected conditions, precautions do apply. After 2 months of daily use without a prescription in a child, talk to your pediatrician before using it for a longer time. Overdosage: If you think you have taken too much of this medicine contact a poison control center or emergency room at once. NOTE: This medicine is only for you. Do not share this medicine with others. What if I miss a dose? If you miss a dose, use it as soon as you remember. If it is almost time for your next dose, use only that dose and continue with your regular schedule. Do not use double or extra doses. What may interact with this medicine? -ketoconazole -metyrapone -some medicines for HIV -vaccines This list may not describe all possible interactions. Give your health care provider a list of all the medicines, herbs, non-prescription drugs, or dietary supplements you use. Also tell them if you smoke, drink alcohol, or use illegal drugs. Some items may interact with your medicine. What should I watch for while using this medicine? Visit your doctor or health care professional for regular checks on your progress. Some symptoms may improve  within 12 hours after starting use. Check with your doctor or health care professional if there is no improvement in your condition after 3 weeks of use. Do not come in contact with people who have chickenpox or the measles while you are taking this  medicine. If you do, call your doctor right away. What side effects may I notice from receiving this medicine? Side effects that you should report to your doctor or health care professional as soon as possible: -allergic reactions like skin rash, itching or hives, swelling of the face, lips, or tongue -changes in vision -flu-like symptoms -white patches or sores in the mouth or nose Side effects that usually do not require medical attention (report to your doctor or health care professional if they continue or are bothersome): -burning or irritation inside the nose or throat -cough -headache -nosebleed -unusual taste or smell This list may not describe all possible side effects. Call your doctor for medical advice about side effects. You may report side effects to FDA at 1-800-FDA-1088. Where should I keep my medicine? Keep out of the reach of children. Store at room temperature between 15 and 30 degrees C (59 and 86 degrees F). Throw away any unused medicine after the expiration date. NOTE: This sheet is a summary. It may not cover all possible information. If you have questions about this medicine, talk to your doctor, pharmacist, or health care provider.  2015, Elsevier/Gold Standard. (2013-08-26 15:55:20)  Take this medicine by mouth with a glass of water. Follow the directions on the prescription label. Avoid breaking, chewing, or sucking the capsule, as this can cause serious side effects. Take your medicine at regular intervals. Do not take your medicine more often than directed. Talk to your pediatrician regarding the use of this medicine in children. While this drug may be prescribed for children as young as 41 years old for selected conditions, precautions do apply. Overdosage: If you think you have taken too much of this medicine contact a poison control center or emergency room at once. NOTE: This medicine is only for you. Do not share this medicine with others. What if I miss a  dose? If you miss a dose, take it as soon as you can. If it is almost time for your next dose, take only that dose. Do not take double or extra doses. What may interact with this medicine? Do not take this medicine with any of the following medications: -MAOIs like Carbex, Eldepryl, Marplan, Nardil, and Parnate This list may not describe all possible interactions. Give your health care provider a list of all the medicines, herbs, non-prescription drugs, or dietary supplements you use. Also tell them if you smoke, drink alcohol, or use illegal drugs. Some items may interact with your medicine. What should I watch for while using this medicine? Tell your doctor if your symptoms do not improve or if they get worse. If you have a high fever, skin rash, or headache, see your health care professional. You may get drowsy or dizzy. Do not drive, use machinery, or do anything that needs mental alertness until you know how this medicine affects you. Do not sit or stand up quickly, especially if you are an older patient. This reduces the risk of dizzy or fainting spells. What side effects may I notice from receiving this medicine? Side effects that you should report to your doctor or health care professional as soon as possible: -allergic reactions like skin rash, itching or hives, swelling of the face, lips,  or tongue -breathing problems -chest pain -confusion or hallucinations -irregular heartbeat -numbness of mouth or throat -seizures Side effects that usually do not require medical attention (report to your doctor or health care professional if they continue or are bothersome): -burning feeling in the eyes -constipation -headache -nasal congestion -stomach upset This list may not describe all possible side effects. Call your doctor for medical advice about side effects. You may report side effects to FDA at 1-800-FDA-1088. Where should I keep my medicine? Keep out of the reach of children. Store  at room temperature between 15 and 30 degrees C (59 and 86 degrees F). Keep tightly closed. Protect from light and moisture. Throw away any unused medicine after the expiration date. NOTE: This sheet is a summary. It may not cover all possible information. If you have questions about this medicine, talk to your doctor, pharmacist, or health care provider.  2015, Elsevier/Gold Standard. (2007-08-05 14:52:56)

## 2014-01-05 NOTE — Progress Notes (Signed)
Brianna Brooks - 28 y.o. female MRN [id number]  Date of birth: 01-10-1986  SUBJECTIVE:  Including CC & ROS.  patient C/O: Cough, dizziness URI HPI: Onset of symptoms: 3 week Symptoms include: no Nasal congestion, no nasal drainage , color of drainage is no, no sore throat, yes fullness in the ears , no sinus pressure, yes headache, no fever, yes 1 week chills, yes 1 week body ache, significant post-nasal drip and throat irritation with drip, yes persistant mostly dry cough through the daily and night and effecting daily function, mucous yellow, clear and sometime blood tinged mucous, sometime SOB, no pleural pain. never history of tobacco use, no history of asthma,. Denies Nausea, vomiting, diarrhea.  Appetite normal , and Drinking fluids Does report history of allergy symptoms with persistent cough with changes in season this happen 1 or 2 times every year. Also has history of GERD and not taking any H2 blocker or PPI Relieving factors:  Mucinex, delsym, no nasal spray, no allergy, no inhaler.  Symptoms not improving but no worse    ROS:  Constitutional:  No fever, chills, or fatigue.  Respiratory: see HPI Cardiovascular:  No palpitations, chest pain or syncope Gastrointestinal:  No nausea, no abdominal pain Review of systems otherwise negative except for what is stated in HPI  HISTORY: Past Medical, Surgical, Social, and Family History Reviewed & Updated per EMR. Pertinent Historical Findings include: SVT, GERD, headaches  PHYSICAL EXAM:  VS: BP:120/72 mmHg  HR:117bpm  TEMP:98.4 F (36.9 C)(Oral)  RESP:98 %  HT:5' 7.5" (171.5 cm)   WT:218 lb (98.884 kg)  BMI:33.7 PHYSICAL EXAM: Recheck of HR on exam - 89 General:  Alert and oriented, No acute distress.   ENT: Eyes are equal and reactive to light, normal conjunctivae, normal hearing, mucous membrane is moist, no erythema, no exudate.  Bilateral ears have fluid with bulging but no erythema, TMs are intact.  Both nasal passages are  inflamed, erythematous, with clear drainage and inflamed turbinate.  Sinus passages are nontender to palpation.  Submandibular glands are fluctuant mobile and soft. HENT:  Normocephalic, Oral mucosa is moist.   Respiratory:  Lungs are clear to auscultation, Respirations are non-labored, bronchospasm present with cough but not with breathing,  Barking cough, Symmetrical chest wall expansion.   Cardiovascular:  Normal rate, Regular rhythm, No murmur, Good pulses equal in all extremities, No edema.   Gastrointestinal:  Soft, Non-tender, Non-distended, Normal bowel sounds, No organomegaly.   Integumentary:  Warm, Dry, No rash.   Neurologic:  Alert, Oriented, No focal defects Psychiatric:  Cooperative, Appropriate mood & affect.    ASSESSMENT & PLAN:  Patient present with persistent dry cough with post-tussive blood tinged mucous intermittently, bronchospasm, postnasal drip triggered by weather changes. Suspect viral URI with post URI bronchospasm with possible allergy/ reactive airway component.  Recommend Flonase for postnasal drip, OTC H2 blocker with decongestant for allergy component, and albtuerol for possible bronchospasm.   Recommended f/u in 10 day if no improvement or in 4 weeks if improved for PFTs   URI Plan: I suspect the patient is suffering from a viral upper respiratory infection based on the short duration of the symptoms, non-productive cough, nasal congestion, clear pharynx, afebrile presentation with subjective fever, normal PO intake, and no significant signs of bacterial infection seen on physical exam. Recommendations for symptomatic relief with over-the-counter agents was recommended.  Patient was provided with a handout to guide them in choosing the appropriate over-the-counter agents for each of their symptoms. Patient was  educated that they will benefit from symptomatic control with OTC decongestants, Tylenol or Motrin for fevers, saline nasal sprays, Plenty of fluids and rest,  return if no better in 5-7 days, sooner if worse.

## 2014-01-07 NOTE — Progress Notes (Signed)
Patient discussed with Dr. Ollen Barges. Agree with assessment and plan of care per her note.

## 2016-03-20 ENCOUNTER — Ambulatory Visit (INDEPENDENT_AMBULATORY_CARE_PROVIDER_SITE_OTHER): Payer: 59 | Admitting: Family Medicine

## 2016-03-20 VITALS — BP 116/80 | HR 75 | Temp 98.3°F | Resp 16 | Ht 67.0 in | Wt 224.4 lb

## 2016-03-20 DIAGNOSIS — R05 Cough: Secondary | ICD-10-CM

## 2016-03-20 DIAGNOSIS — J45909 Unspecified asthma, uncomplicated: Secondary | ICD-10-CM | POA: Diagnosis not present

## 2016-03-20 DIAGNOSIS — R059 Cough, unspecified: Secondary | ICD-10-CM

## 2016-03-20 MED ORDER — GUAIFENESIN-CODEINE 200-10 MG/5ML PO LIQD: 5.0000 mL | Freq: Three times a day (TID) | ORAL | 0 refills | Status: DC | PRN

## 2016-03-20 MED ORDER — PREDNISONE 10 MG PO TABS: ORAL_TABLET | ORAL | 0 refills | Status: DC

## 2016-03-20 NOTE — Progress Notes (Signed)
   Brianna Brooks is a 30 y.o. female who presents to [facility] today for cough:  1.  Cough:  Present for past week.  Nonproductive.  Worse when being active.  Has history of "asthma" without PFTs.  Not using her albuterol inhaler currently, states doesn't work.  Has dried cough drops without relief.  No fevers or chills.  No recent URI symptoms.  No sick contacts.   ROS as above.    PMH reviewed. Patient is a nonsmoker.   Past Medical History:  Diagnosis Date  . Abnormal Pap smear 06/2011   WAS TO HAVE COLPO;HAS NOT HAD YET, HAD + UPT  . Fibroid   . GERD (gastroesophageal reflux disease)    NOT FREQUENT, HAS MORE WITH PREGNANCY  . Headache(784.0)    MIGRAINES;USUALLY SLEEPS  . Infection    UTI NOT FREQUENT  . SVT (supraventricular tachycardia) (HCC)    Past Surgical History:  Procedure Laterality Date  . HYSTEROSCOPY  06/28/2011   IN OFFICE IUD REMOVAL WITH [facility]  . HYSTEROSCOPY    . NO PAST SURGERIES      Medications reviewed. Current Outpatient Prescriptions  Medication Sig Dispense Refill  . albuterol (PROVENTIL HFA;VENTOLIN HFA) 108 (90 BASE) MCG/ACT inhaler Inhale 2 puffs into the lungs every 4 (four) hours as needed for wheezing or shortness of breath (cough, shortness of breath or wheezing.). (Patient not taking: Reported on 03/20/2016) 1 Inhaler 1  . benzonatate (TESSALON) 200 MG capsule Take 1 capsule (200 mg total) by mouth 3 (three) times daily as needed for cough. (Patient not taking: Reported on 03/20/2016) 20 capsule 0  . fluticasone (FLONASE) 50 MCG/ACT nasal spray Place 2 sprays into both nostrils daily. (Patient not taking: Reported on 03/20/2016) 16 g 3   No current facility-administered medications for this visit.      Physical Exam:  BP 116/80   Pulse 75   Temp 98.3 F (36.8 C) (Oral)   Resp 16   Ht 5\' 7"  (1.702 m)   Wt 224 lb 6.4 oz (101.8 kg)   LMP 03/13/2016   SpO2 100%   BMI 35.15 kg/m  Gen:  Patient sitting on exam table,  appears stated age in no acute distress Head: Normocephalic atraumatic Eyes: EOMI, PERRL, sclera and conjunctiva non-erythematous Ears:  Canals clear bilaterally.  TMs pearly gray bilaterally without erythema or bulging.   Nose:  Nasal turbinates grossly enlarged bilaterally with some bogginess. Mouth: Mucosa membranes moist. Tonsils +2, nonenlarged, non-erythematous. Neck: No cervical lymphadenopathy noted Heart:  RRR, no murmurs auscultated. Pulm:  Not great air movement.   No actual wheezing     Assessment and Plan:  1.  Cough: - likely reactive airway disease, possibility of asthma exacerbation though she reports not having any improvement with albuterol.  - prednisone to treat. Cough syrup for symptom improvement.  - recommend PFTs in next 4 - 6 weeks to assess for true asthma.

## 2016-03-20 NOTE — Patient Instructions (Addendum)
It was good to see you today!  Use the cough syrup to help with your cough.  This may make you a little sleepy.  Take the prednisone as described:  Take 6 pills today, 5 pills tomorrow, 4 pills today after, 3 pills after, 2 pills after, 1 pill last day.    Let me know if this isn't helping.   IF you received an x-ray today, you will receive an invoice from [facility]. Please contact [facility] at [phone] with questions or concerns regarding your invoice.   IF you received labwork today, you will receive an invoice from [employer]. Please contact [facility] at [phone] with questions or concerns regarding your invoice.   Our billing staff will not be able to assist you with questions regarding bills from these companies.  You will be contacted with the lab results as soon as they are available. The fastest way to get your results is to activate your My Chart account. Instructions are located on the last page of this paperwork. If you have not heard from [place] regarding the results in 2 weeks, please contact this office.

## 2016-03-21 ENCOUNTER — Telehealth: Payer: Self-pay

## 2016-03-21 NOTE — Telephone Encounter (Signed)
Pharm called to report that the guaifenesin-codeine liqd only comes in a strength of 100- 10mg /79ml, and typically they get directions the same as what Dr Mingo Amber wrote for. I OKd the change since no change in codeine amount. Dr Mingo Amber, are you OK with the change as is, or would you like me to call pt and have them take extra  OTC (Mucinex?)?

## 2016-03-21 NOTE — Telephone Encounter (Signed)
That change is fine, thanks for your help.

## 2016-03-23 ENCOUNTER — Ambulatory Visit (INDEPENDENT_AMBULATORY_CARE_PROVIDER_SITE_OTHER): Payer: 59 | Admitting: Urgent Care

## 2016-03-23 VITALS — BP 120/76 | HR 90 | Temp 98.3°F | Resp 18 | Ht 67.0 in | Wt 229.0 lb

## 2016-03-23 DIAGNOSIS — R059 Cough, unspecified: Secondary | ICD-10-CM

## 2016-03-23 DIAGNOSIS — R0789 Other chest pain: Secondary | ICD-10-CM

## 2016-03-23 DIAGNOSIS — R05 Cough: Secondary | ICD-10-CM

## 2016-03-23 DIAGNOSIS — J209 Acute bronchitis, unspecified: Secondary | ICD-10-CM

## 2016-03-23 MED ORDER — HYDROCODONE-HOMATROPINE 5-1.5 MG/5ML PO SYRP: 5.0000 mL | ORAL_SOLUTION | Freq: Every evening | ORAL | 0 refills | Status: DC | PRN

## 2016-03-23 MED ORDER — AZITHROMYCIN 250 MG PO TABS: ORAL_TABLET | ORAL | 0 refills | Status: DC

## 2016-03-23 MED ORDER — ALBUTEROL SULFATE HFA 108 (90 BASE) MCG/ACT IN AERS: 2.0000 | INHALATION_SPRAY | Freq: Four times a day (QID) | RESPIRATORY_TRACT | 1 refills | Status: DC | PRN

## 2016-03-23 MED ORDER — BENZONATATE 100 MG PO CAPS
100.0000 mg | ORAL_CAPSULE | Freq: Three times a day (TID) | ORAL | 0 refills | Status: DC | PRN
Start: 2016-03-23 — End: 2017-01-23

## 2016-03-23 NOTE — Patient Instructions (Addendum)
Cough, Adult Coughing is a reflex that clears your throat and your airways. Coughing helps to heal and protect your lungs. It is normal to cough occasionally, but a cough that happens with other symptoms or lasts a long time may be a sign of a condition that needs treatment. A cough may last only 2-3 weeks (acute), or it may last longer than 8 weeks (chronic). CAUSES Coughing is commonly caused by:  Breathing in substances that irritate your lungs.  A viral or bacterial respiratory infection.  Allergies.  Asthma.  Postnasal drip.  Smoking.  Acid backing up from the stomach into the esophagus (gastroesophageal reflux).  Certain medicines.  Chronic lung problems, including COPD (or rarely, lung cancer).  Other medical conditions such as heart failure. HOME CARE INSTRUCTIONS  Pay attention to any changes in your symptoms. Take these actions to help with your discomfort:  Take medicines only as told by your health care provider.  If you were prescribed an antibiotic medicine, take it as told by your health care provider. Do not stop taking the antibiotic even if you start to feel better.  Talk with your health care provider before you take a cough suppressant medicine.  Drink enough fluid to keep your urine clear or pale yellow.  If the air is dry, use a cold steam vaporizer or humidifier in your bedroom or your home to help loosen secretions.  Avoid anything that causes you to cough at work or at home.  If your cough is worse at night, try sleeping in a semi-upright position.  Avoid cigarette smoke. If you smoke, quit smoking. If you need help quitting, ask your health care provider.  Avoid caffeine.  Avoid alcohol.  Rest as needed. SEEK MEDICAL CARE IF:   You have new symptoms.  You cough up pus.  Your cough does not get better after 2-3 weeks, or your cough gets worse.  You cannot control your cough with suppressant medicines and you are losing sleep.  You  develop pain that is getting worse or pain that is not controlled with pain medicines.  You have a fever.  You have unexplained weight loss.  You have night sweats. SEEK IMMEDIATE MEDICAL CARE IF:  You cough up blood.  You have difficulty breathing.  Your heartbeat is very fast.   This information is not intended to replace advice given to you by your health care provider. Make sure you discuss any questions you have with your health care provider.   Document Released: 11/02/2010 Document Revised: 01/25/2015 Document Reviewed: 07/13/2014 Elsevier Interactive Patient Education 2016 Elsevier Inc.     Acute Bronchitis Bronchitis is inflammation of the airways that extend from the windpipe into the lungs (bronchi). The inflammation often causes mucus to develop. This leads to a cough, which is the most common symptom of bronchitis.  In acute bronchitis, the condition usually develops suddenly and goes away over time, usually in a couple weeks. Smoking, allergies, and asthma can make bronchitis worse. Repeated episodes of bronchitis may cause further lung problems.  CAUSES Acute bronchitis is most often caused by the same virus that causes a cold. The virus can spread from person to person (contagious) through coughing, sneezing, and touching contaminated objects. SIGNS AND SYMPTOMS   Cough.   Fever.   Coughing up mucus.   Body aches.   Chest congestion.   Chills.   Shortness of breath.   Sore throat.  DIAGNOSIS  Acute bronchitis is usually diagnosed through a physical exam. Your  health care provider will also ask you questions about your medical history. Tests, such as chest X-rays, are sometimes done to rule out other conditions.  TREATMENT  Acute bronchitis usually goes away in a couple weeks. Oftentimes, no medical treatment is necessary. Medicines are sometimes given for relief of fever or cough. Antibiotic medicines are usually not needed but may be  prescribed in certain situations. In some cases, an inhaler may be recommended to help reduce shortness of breath and control the cough. A cool mist vaporizer may also be used to help thin bronchial secretions and make it easier to clear the chest.  HOME CARE INSTRUCTIONS  Get plenty of rest.   Drink enough fluids to keep your urine clear or pale yellow (unless you have a medical condition that requires fluid restriction). Increasing fluids may help thin your respiratory secretions (sputum) and reduce chest congestion, and it will prevent dehydration.   Take medicines only as directed by your health care provider.  If you were prescribed an antibiotic medicine, finish it all even if you start to feel better.  Avoid smoking and secondhand smoke. Exposure to cigarette smoke or irritating chemicals will make bronchitis worse. If you are a smoker, consider using nicotine gum or skin patches to help control withdrawal symptoms. Quitting smoking will help your lungs heal faster.   Reduce the chances of another bout of acute bronchitis by washing your hands frequently, avoiding people with cold symptoms, and trying not to touch your hands to your mouth, nose, or eyes.   Keep all follow-up visits as directed by your health care provider.  SEEK MEDICAL CARE IF: Your symptoms do not improve after 1 week of treatment.  SEEK IMMEDIATE MEDICAL CARE IF:  You develop an increased fever or chills.   You have chest pain.   You have severe shortness of breath.  You have bloody sputum.   You develop dehydration.  You faint or repeatedly feel like you are going to pass out.  You develop repeated vomiting.  You develop a severe headache. MAKE SURE YOU:   Understand these instructions.  Will watch your condition.  Will get help right away if you are not doing well or get worse.   This information is not intended to replace advice given to you by your health care provider. Make sure you  discuss any questions you have with your health care provider.   Document Released: 06/13/2004 Document Revised: 05/27/2014 Document Reviewed: 10/27/2012 Elsevier Interactive Patient Education 2016 [employer].    IF you received an x-ray today, you will receive an invoice from [facility]. Please contact [facility] at [phone] with questions or concerns regarding your invoice.   IF you received labwork today, you will receive an invoice from [employer]. Please contact [facility] at [phone] with questions or concerns regarding your invoice.   Our billing staff will not be able to assist you with questions regarding bills from these companies.  You will be contacted with the lab results as soon as they are available. The fastest way to get your results is to activate your My Chart account. Instructions are located on the last page of this paperwork. If you have not heard from [place] regarding the results in 2 weeks, please contact this office.

## 2016-03-23 NOTE — Progress Notes (Signed)
    MRN: [id number] DOB: [birth year]  Subjective:   Brianna Brooks is a 30 y.o. female presenting for follow up on bronchitis.   Patient initially seen on 03/20/2016, treated for bronchitis with prednisone and cough syrup. Today, reports her cough is worsening, associated with chest pain, shob, wheezing. Has tried prednisone, cough syrup without any relief. Of note, patient has a questionable history of history asthma. Has not used an albuterol inhaler consistently. Denies fever, sinus pain, congestion, n/v, abdominal pain. Denies smoking cigarettes.  Brianna Brooks has a current medication list which includes the following prescription(s): guaifenesin-codeine and prednisone. Also has No Known Allergies.  Brianna Brooks  has a past medical history of Abnormal Pap smear (06/2011); Fibroid; GERD (gastroesophageal reflux disease); Headache(784.0); Infection; and SVT (supraventricular tachycardia) ([place]). Also  has a past surgical history that includes No past surgeries; Hysteroscopy (06/28/2011); and Hysteroscopy.  Objective:   Vitals: BP 120/76   Pulse 90   Temp 98.3 F (36.8 C) (Oral)   Resp 18   Ht 5\' 7"  (1.702 m)   Wt 229 lb (103.9 kg)   LMP 03/13/2016   SpO2 98%   BMI 35.87 kg/m   Physical Exam  Constitutional: She is oriented to person, place, and time. She appears well-developed and well-nourished.  HENT:  Mouth/Throat: Oropharynx is clear and moist.  Cardiovascular: Normal rate, regular rhythm and intact distal pulses.  Exam reveals no gallop and no friction rub.   No murmur heard. Pulmonary/Chest: No respiratory distress. She has no wheezes. She has no rales.  Coarse lung sounds over mid-lower lung fields bilaterally.  Neurological: She is alert and oriented to person, place, and time.   Assessment and Plan :   1. Acute bronchitis, unspecified organism 2. Cough 3. Atypical chest pain - I will cover patient for infectious process with Azithromycin. Patient declined chest x-ray today but will  rtc if no improvement. Schedule albuterol inhaler, cough suppression with Hycodan, Tessalon; stop codeine cough syrup.  Jaynee Eagles, PA-C [facility] [phone] 03/23/2016 2:32 PM

## 2016-03-25 ENCOUNTER — Encounter: Payer: Self-pay | Admitting: Urgent Care

## 2016-03-27 ENCOUNTER — Ambulatory Visit (INDEPENDENT_AMBULATORY_CARE_PROVIDER_SITE_OTHER): Payer: 59 | Admitting: Family Medicine

## 2016-03-27 VITALS — BP 104/62 | HR 80 | Temp 98.3°F | Resp 16 | Ht 66.75 in | Wt 270.0 lb

## 2016-03-27 DIAGNOSIS — R062 Wheezing: Secondary | ICD-10-CM

## 2016-03-27 MED ORDER — PREDNISONE 50 MG PO TABS: ORAL_TABLET | ORAL | 0 refills | Status: DC

## 2016-03-27 MED ORDER — ALBUTEROL SULFATE (2.5 MG/3ML) 0.083% IN NEBU
2.5000 mg | INHALATION_SOLUTION | Freq: Once | RESPIRATORY_TRACT | Status: AC
  Administered 2016-03-27: 2.5 mg via RESPIRATORY_TRACT

## 2016-03-27 MED ORDER — IPRATROPIUM-ALBUTEROL 18-103 MCG/ACT IN AERO: 2.0000 | INHALATION_SPRAY | RESPIRATORY_TRACT | 1 refills | Status: DC | PRN

## 2016-03-27 MED ORDER — IPRATROPIUM BROMIDE 0.02 % IN SOLN
0.5000 mg | Freq: Once | RESPIRATORY_TRACT | Status: AC
  Administered 2016-03-27: 0.5 mg via RESPIRATORY_TRACT

## 2016-03-27 NOTE — Patient Instructions (Addendum)
Use the Combivent inhaler every 4-6 hours as needed for cough. Make sure to also use this before you go to sleep at night. This is a combination medicine just like what we gave you in clinic.  If for some reason insurance will not pay for it because it is too expensive, let us know and I can send in the 2 medicines separately.  Take the prednisone one pill daily for the next 5 days. This will help calm everything down in the lungs.  Pick up some cetirizine which is over-the-counter generic Zyrtec. This will help with the post nasal drip and allergy symptoms.  Like we talked about cyanosis long cough can linger for the next 2-3-4 weeks. If he started noticing that her cough gets worse like it was now come back and see if we can do another breathing treatment. We can also try sending you a nebulizer machine at home.  Finally, come back in about 6 weeks for formal asthma testing, once all this had cleared up.   It was good to see you today!  Feel better    IF you received an x-ray today, you will receive an invoice from [facility]. Please contact [facility] at [phone] with questions or concerns regarding your invoice.   IF you received labwork today, you will receive an invoice from [employer]. Please contact [facility] at [phone] with questions or concerns regarding your invoice.   Our billing staff will not be able to assist you with questions regarding bills from these companies.  You will be contacted with the lab results as soon as they are available. The fastest way to get your results is to activate your My Chart account. Instructions are located on the last page of this paperwork. If you have not heard from [place] regarding the results in 2 weeks, please contact this office.

## 2016-03-27 NOTE — Progress Notes (Signed)
Brianna Brooks is a 30 y.o. female who presents to [facility] today for persistent cough:  1.  Cough:  Persistent cough now for 2 weeks. She was seen here November 1 after week's worth of cough. At that point she was given prednisone and cough medicine/codeine. She did not feel like this helped after the prednisone wore off and came back several days later. At that point she was treated with azithromycin and Hycodan. These have helped somewhat. She has been using albuterol inhaler at home which last for 30-45 minutes then cough returns.    Cough is dry and hacking. Worse when she has to talk. Worse when going out to the cold. She is waking at night with a dry cough. No fevers or chills.  No abdominal pain nausea vomiting. No lower extremity edema. No chest pain with exertion.  ROS as above.    PMH reviewed. Patient is a nonsmoker.   Past Medical History:  Diagnosis Date  . Abnormal Pap smear 06/2011   WAS TO HAVE COLPO;HAS NOT HAD YET, HAD + UPT  . Fibroid   . GERD (gastroesophageal reflux disease)    NOT FREQUENT, HAS MORE WITH PREGNANCY  . Headache(784.0)    MIGRAINES;USUALLY SLEEPS  . Infection    UTI NOT FREQUENT  . SVT (supraventricular tachycardia) ([facility])    Past Surgical History:  Procedure Laterality Date  . HYSTEROSCOPY  06/28/2011   IN OFFICE IUD REMOVAL WITH [facility]  . HYSTEROSCOPY    . NO PAST SURGERIES      Medications reviewed. Current Outpatient Prescriptions  Medication Sig Dispense Refill  . albuterol (PROVENTIL HFA;VENTOLIN HFA) 108 (90 Base) MCG/ACT inhaler Inhale 2 puffs into the lungs every 6 (six) hours as needed for wheezing or shortness of breath (cough, shortness of breath or wheezing.). 1 Inhaler 1  . benzonatate (TESSALON) 100 MG capsule Take 1-2 capsules (100-200 mg total) by mouth 3 (three) times daily as needed for cough. 40 capsule 0  . Guaifenesin-Codeine 200-10 MG/5ML LIQD Take 5 mLs by mouth 3 (three) times daily as needed. Dispense  QS x 1 week 1 Bottle 0  . HYDROcodone-homatropine (HYCODAN) 5-1.5 MG/5ML syrup Take 5 mLs by mouth at bedtime as needed. 120 mL 0  . predniSONE (DELTASONE) 10 MG tablet Take 6 pills today, 5 pills tomorrow, 4 pills today after, 3 pills after, 2 pills after, 1 pill last day (Patient not taking: Reported on 03/27/2016) 21 tablet 0   No current facility-administered medications for this visit.      Physical Exam:  BP 104/62 (BP Location: Left Arm, Patient Position: Sitting, Cuff Size: Large)   Pulse 80   Temp 98.3 F (36.8 C) (Oral)   Resp 16   Ht 5' 6.75" (1.695 m)   Wt 270 lb (122.5 kg)   LMP 03/13/2016   SpO2 99%   BMI 42.61 kg/m  Gen:  Alert, cooperative patient who appears stated age in no acute distress.  Vital signs reviewed.  She can talk in complete sentences but does have trouble with coughing interrupting her while talking.  Looks uncomfortable from persistent coughing HEENT: EOMI,  MMM Pulm:  Poor air movement.  Dry hacking cough whenever she attempts deep breath.  Some wheezing at bases not better with coughing.  Cardiac:  Regular rate and rhythm  Ext: No LE edema  Assessment and Plan:  1.  Asthma exacerbation: - I think this is most likely what's going on especially as she was  having wheezing here in clinic.  No formal diagnosis of asthma.  -Treated with nebulizer treatment of albuterol and Atrovent here in clinic. -She was markedly better after the nebulizer treatment. She is able to talk in complete sentences. Had no further coughing. She had much better air movement and no wheezing on lung exam. -Treating her with Combivent inhaler. Also 5 days of 50 mg prednisone. -Also adding cetirizine over-the-counter. -See instructions for the rest. She should come back the next 6-8 weeks for formal asthma testing.

## 2016-03-28 ENCOUNTER — Other Ambulatory Visit: Payer: Self-pay

## 2016-03-28 MED ORDER — IPRATROPIUM-ALBUTEROL 20-100 MCG/ACT IN AERS: 1.0000 | INHALATION_SPRAY | Freq: Four times a day (QID) | RESPIRATORY_TRACT | 5 refills | Status: DC

## 2016-03-28 NOTE — Telephone Encounter (Signed)
Per fax from [facility] has been discontinued. They requested substitute.  Discussed with Dr. Everlene Farrier - Changed to Combivent Respimat 0 1 inhalation q 6 hours.  Dispense 1 inhaler, 5 refills.  Ordered & Combivent discontinued

## 2017-01-23 ENCOUNTER — Ambulatory Visit (INDEPENDENT_AMBULATORY_CARE_PROVIDER_SITE_OTHER): Payer: 59 | Admitting: Urgent Care

## 2017-01-23 ENCOUNTER — Encounter: Payer: Self-pay | Admitting: Urgent Care

## 2017-01-23 VITALS — BP 122/77 | HR 77 | Temp 98.4°F | Resp 16 | Ht 66.75 in | Wt 223.2 lb

## 2017-01-23 DIAGNOSIS — R062 Wheezing: Secondary | ICD-10-CM

## 2017-01-23 DIAGNOSIS — J4520 Mild intermittent asthma, uncomplicated: Secondary | ICD-10-CM

## 2017-01-23 DIAGNOSIS — R05 Cough: Secondary | ICD-10-CM

## 2017-01-23 DIAGNOSIS — R059 Cough, unspecified: Secondary | ICD-10-CM

## 2017-01-23 DIAGNOSIS — R0789 Other chest pain: Secondary | ICD-10-CM

## 2017-01-23 MED ORDER — IPRATROPIUM BROMIDE 0.02 % IN SOLN
0.5000 mg | Freq: Once | RESPIRATORY_TRACT | Status: AC
  Administered 2017-01-23: 0.5 mg via RESPIRATORY_TRACT

## 2017-01-23 MED ORDER — ALBUTEROL SULFATE HFA 108 (90 BASE) MCG/ACT IN AERS: 2.0000 | INHALATION_SPRAY | Freq: Four times a day (QID) | RESPIRATORY_TRACT | 5 refills | Status: DC | PRN

## 2017-01-23 MED ORDER — ALBUTEROL SULFATE (2.5 MG/3ML) 0.083% IN NEBU
2.5000 mg | INHALATION_SOLUTION | Freq: Once | RESPIRATORY_TRACT | Status: AC
  Administered 2017-01-23: 2.5 mg via RESPIRATORY_TRACT

## 2017-01-23 NOTE — Progress Notes (Signed)
    MRN: [id number] DOB: 1986/01/28  Subjective:   Brianna Brooks is a 31 y.o. female presenting for chief complaint of asthma flare (onset x 2-3 days,  a little wheezing with inhalation, needs refill  on albuterol as it has expired)  Reports 3 day history of dry cough, wheezing, chest tightness, shob. Denies fever, chest pain, sore throat, n/v, abdominal pain. She does not have an albuterol inhaler currently and therefore could not use it as her rescue inhaler. Denies smoking cigarettes.  Erva takes albuterol inhaler. Also has No Known Allergies.  [name]  has a past medical history of Abnormal Pap smear (06/2011); Fibroid; GERD (gastroesophageal reflux disease); Headache(784.0); Infection; and SVT (supraventricular tachycardia) ([place]). Also  has a past surgical history that includes No past surgeries; Hysteroscopy (06/28/2011); and Hysteroscopy.  Objective:   Vitals: BP 122/77 (BP Location: Right Arm, Patient Position: Sitting, Cuff Size: Large)   Pulse 77   Temp 98.4 F (36.9 C) (Oral)   Resp 16   Ht 5' 6.75" (1.695 m)   Wt 223 lb 3.2 oz (101.2 kg)   LMP 01/23/2017   SpO2 98%   BMI 35.22 kg/m   Physical Exam  Constitutional: She is oriented to person, place, and time. She appears well-developed and well-nourished.  HENT:  Mouth/Throat: Oropharynx is clear and moist.  Eyes: No scleral icterus.  Neck: Normal range of motion. Neck supple.  Cardiovascular: Normal rate, regular rhythm and intact distal pulses.  Exam reveals no gallop and no friction rub.   No murmur heard. Pulmonary/Chest: No respiratory distress. She has no wheezes. She has no rales.  Lymphadenopathy:    She has no cervical adenopathy.  Neurological: She is alert and oriented to person, place, and time.  Skin: Skin is warm and dry.  Psychiatric: She has a normal mood and affect.   Assessment and Plan :   1. Cough 2. Wheezing 3. Mild intermittent extrinsic asthma without complication 4. Chest tightness -  Refilled albuterol inhaler. Patient felt somewhat improved s/p nebulizer treatment. She declined x-rays today. Return-to-clinic precautions discussed, patient verbalized understanding.   Jaynee Eagles, PA-C [facility] [phone] 01/23/2017  6:44 PM

## 2017-01-23 NOTE — Patient Instructions (Addendum)
Asthma, Adult Asthma is a recurring condition in which the airways tighten and narrow. Asthma can make it difficult to breathe. It can cause coughing, wheezing, and shortness of breath. Asthma episodes, also called asthma attacks, range from minor to life-threatening. Asthma cannot be cured, but medicines and lifestyle changes can help control it. What are the causes? Asthma is believed to be caused by inherited (genetic) and environmental factors, but its exact cause is unknown. Asthma may be triggered by allergens, lung infections, or irritants in the air. Asthma triggers are different for each person. Common triggers include:  Animal dander.  Dust mites.  Cockroaches.  Pollen from trees or grass.  Mold.  Smoke.  Air pollutants such as dust, household cleaners, hair sprays, aerosol sprays, paint fumes, strong chemicals, or strong odors.  Cold air, weather changes, and winds (which increase molds and pollens in the air).  Strong emotional expressions such as crying or laughing hard.  Stress.  Certain medicines (such as aspirin) or types of drugs (such as beta-blockers).  Sulfites in foods and drinks. Foods and drinks that may contain sulfites include dried fruit, potato chips, and sparkling grape juice.  Infections or inflammatory conditions such as the flu, a cold, or an inflammation of the nasal membranes (rhinitis).  Gastroesophageal reflux disease (GERD).  Exercise or strenuous activity.  What are the signs or symptoms? Symptoms may occur immediately after asthma is triggered or many hours later. Symptoms include:  Wheezing.  Excessive nighttime or early morning coughing.  Frequent or severe coughing with a common cold.  Chest tightness.  Shortness of breath.  How is this diagnosed? The diagnosis of asthma is made by a review of your medical history and a physical exam. Tests may also be performed. These may include:  Lung function studies. These tests show how  much air you breathe in and out.  Allergy tests.  Imaging tests such as X-rays.  How is this treated? Asthma cannot be cured, but it can usually be controlled. Treatment involves identifying and avoiding your asthma triggers. It also involves medicines. There are 2 classes of medicine used for asthma treatment:  Controller medicines. These prevent asthma symptoms from occurring. They are usually taken every day.  Reliever or rescue medicines. These quickly relieve asthma symptoms. They are used as needed and provide short-term relief.  Your health care provider will help you create an asthma action plan. An asthma action plan is a written plan for managing and treating your asthma attacks. It includes a list of your asthma triggers and how they may be avoided. It also includes information on when medicines should be taken and when their dosage should be changed. An action plan may also involve the use of a device called a peak flow meter. A peak flow meter measures how well the lungs are working. It helps you monitor your condition. Follow these instructions at home:  Take medicines only as directed by your health care provider. Speak with your health care provider if you have questions about how or when to take the medicines.  Use a peak flow meter as directed by your health care provider. Record and keep track of readings.  Understand and use the action plan to help minimize or stop an asthma attack without needing to seek medical care.  Control your home environment in the following ways to help prevent asthma attacks: ? Do not smoke. Avoid being exposed to secondhand smoke. ? Change your heating and air conditioning filter regularly. ? Limit   your use of fireplaces and wood stoves. ? Get rid of pests (such as roaches and mice) and their droppings. ? Throw away plants if you see mold on them. ? Clean your floors and dust regularly. Use unscented cleaning products. ? Try to have someone  else vacuum for you regularly. Stay out of rooms while they are being vacuumed and for a short while afterward. If you vacuum, use a dust mask from a hardware store, a double-layered or microfilter vacuum cleaner bag, or a vacuum cleaner with a HEPA filter. ? Replace carpet with wood, tile, or vinyl flooring. Carpet can trap dander and dust. ? Use allergy-proof pillows, mattress covers, and box spring covers. ? Wash bed sheets and blankets every week in hot water and dry them in a dryer. ? Use blankets that are made of polyester or cotton. ? Clean bathrooms and kitchens with bleach. If possible, have someone repaint the walls in these rooms with mold-resistant paint. Keep out of the rooms that are being cleaned and painted. ? Wash hands frequently. Contact a health care provider if:  You have wheezing, shortness of breath, or a cough even if taking medicine to prevent attacks.  The colored mucus you cough up (sputum) is thicker than usual.  Your sputum changes from clear or white to yellow, green, gray, or bloody.  You have any problems that may be related to the medicines you are taking (such as a rash, itching, swelling, or trouble breathing).  You are using a reliever medicine more than 2-3 times per week.  Your peak flow is still at 50-79% of your personal best after following your action plan for 1 hour.  You have a fever. Get help right away if:  You seem to be getting worse and are unresponsive to treatment during an asthma attack.  You are short of breath even at rest.  You get short of breath when doing very little physical activity.  You have difficulty eating, drinking, or talking due to asthma symptoms.  You develop chest pain.  You develop a fast heartbeat.  You have a bluish color to your lips or fingernails.  You are light-headed, dizzy, or faint.  Your peak flow is less than 50% of your personal best. This information is not intended to replace advice given to  you by your health care provider. Make sure you discuss any questions you have with your health care provider. Document Released: 05/06/2005 Document Revised: 10/18/2015 Document Reviewed: 12/03/2012 Elsevier Interactive Patient Education  2017 [employer].     IF you received an x-ray today, you will receive an invoice from [facility]. Please contact [facility] Radiology at [phone] with questions or concerns regarding your invoice.   IF you received labwork today, you will receive an invoice from [facility]. Please contact [facility] at [phone] with questions or concerns regarding your invoice.   Our billing staff will not be able to assist you with questions regarding bills from these companies.  You will be contacted with the lab results as soon as they are available. The fastest way to get your results is to activate your My Chart account. Instructions are located on the last page of this paperwork. If you have not heard from [place] regarding the results in 2 weeks, please contact this office.

## 2017-01-29 ENCOUNTER — Encounter: Payer: Self-pay | Admitting: Physician Assistant

## 2017-01-29 ENCOUNTER — Ambulatory Visit (INDEPENDENT_AMBULATORY_CARE_PROVIDER_SITE_OTHER): Payer: 59 | Admitting: Physician Assistant

## 2017-01-29 ENCOUNTER — Ambulatory Visit (INDEPENDENT_AMBULATORY_CARE_PROVIDER_SITE_OTHER): Payer: 59

## 2017-01-29 VITALS — BP 118/72 | HR 98 | Resp 16 | Ht 67.0 in | Wt 224.0 lb

## 2017-01-29 DIAGNOSIS — R05 Cough: Secondary | ICD-10-CM

## 2017-01-29 DIAGNOSIS — R053 Chronic cough: Secondary | ICD-10-CM

## 2017-01-29 MED ORDER — FLUTICASONE PROPIONATE HFA 44 MCG/ACT IN AERO: 2.0000 | INHALATION_SPRAY | Freq: Two times a day (BID) | RESPIRATORY_TRACT | 1 refills | Status: DC

## 2017-01-29 NOTE — Patient Instructions (Addendum)
It was great to meet you!  Start the [employer]. Take 2 puffs in the morning and at night.  Let's get a chest xray today, we will call you with the results.  Let's follow up in 1 month, sooner if needed, to discuss your asthma.

## 2017-01-29 NOTE — Progress Notes (Signed)
Brianna Brooks is a 31 y.o. female here for a new problem and to establish care.   History of Present Illness:   Chief Complaint  Patient presents with  . Establish Care  . Asthma    HPI   Asthma -- patient reports that she has never been formally diagnosed with asthma. No hx of PNA. When summer transitions to fall, she develops symptoms, that is the only time during the year. For the past 3 years this has occured. Symptoms include dry cough, shortness of breath and occasional wheezing.  Starts around middle of August each year. Symptoms persist when talking and exercising -- has dry cough. If she is exercising she has no symptoms. Never has cough in the middle of night. Non-smoker. No environmental changes that she has been made aware of. Has had a chest xray in the past, over 1 year ago. Currently only on Albuterol Inhaler prn, was on Combivent last year in December, but this has since expired. Was seen at [facility] on 01/23/17 for presumed asthma exacerbation, required nebulizer breathing treatment and responded well. She has occasional chest tightness, but denies chest pain. She has never seen a pulmonologist. She is a non-smoker. She has been afebrile with recent coughing spells.  Past Medical History:  Diagnosis Date  . Abnormal Pap smear 06/2011   WAS TO HAVE COLPO;HAS NOT HAD YET, HAD + UPT  . Fibroid   . Headache(784.0)    MIGRAINES;USUALLY SLEEPS  . SVT (supraventricular tachycardia) (HCC)      Social History   Social History  . Marital status: Married    Spouse name: TERRENCE Fialkowski  . Number of children: 2  . Years of education: 12.5   Occupational History  . [employer]   Social History Main Topics  . Smoking status: Never Smoker  . Smokeless tobacco: Never Used  . Alcohol use No  . Drug use: No  . Sexual activity: Yes    Partners: Male    Birth control/ protection: None     Comment: PT STATES NO UNPROTECTED I/C X 14 DAYS   Other  Topics Concern  . Not on file   Social History Narrative   Works at [employer]   3 children   In a relationship    Past Surgical History:  Procedure Laterality Date  . HYSTEROSCOPY  06/28/2011   IN OFFICE IUD REMOVAL WITH [facility]  . HYSTEROSCOPY    . NO PAST SURGERIES      Family History  Problem Relation Age of Onset  . Heart attack Maternal Grandmother   . Hypertension Maternal Grandmother   . Asthma Mother   . Cholelithiasis Mother   . Migraines Mother   . Asthma Sister   . Asthma Brother   . Cancer Paternal Grandfather        COLON  . Hyperthyroidism Paternal Grandmother   . Heart disease Maternal Aunt        PACE MAKER  . Hypertension Maternal Aunt   . Hypertension Maternal Uncle   . Asthma Maternal Aunt   . Cancer Maternal Aunt        OVARIAN  . Cancer Maternal Aunt        OVARIAN  . Anesthesia problems Neg Hx     No Known Allergies  Current Medications:   Current Outpatient Prescriptions:  .  levonorgestrel (MIRENA, 52 MG,) 20 MCG/24HR IUD, Mirena, Disp: , Rfl:  .  albuterol (PROVENTIL HFA;VENTOLIN HFA) 108 (90  Base) MCG/ACT inhaler, Inhale 2 puffs into the lungs every 6 (six) hours as needed for wheezing or shortness of breath (cough, shortness of breath or wheezing.)., Disp: 1 Inhaler, Rfl: 5 .  fluticasone (FLOVENT HFA) 44 MCG/ACT inhaler, Inhale 2 puffs into the lungs 2 (two) times daily., Disp: 1 Inhaler, Rfl: 1   Review of Systems:   Review of Systems  Constitutional: Negative for chills, fever, malaise/fatigue and weight loss.  HENT: Negative for congestion, ear pain, sinus pain and sore throat.   Respiratory: Positive for cough (dry) and shortness of breath.   Cardiovascular: Negative for chest pain, orthopnea, claudication and leg swelling.  Gastrointestinal: Negative for heartburn, nausea and vomiting.  Neurological: Negative for dizziness, tingling and headaches.    Vitals:   Vitals:   01/29/17 1526  BP: 118/72  Pulse: 98  Resp: 16   SpO2: 98%  Weight: 224 lb (101.6 kg)  Height: 5\' 7"  (1.702 m)     Body mass index is 35.08 kg/m.  Physical Exam:   Physical Exam  Constitutional: She appears well-developed. She is cooperative.  Non-toxic appearance. She does not have a sickly appearance. She does not appear ill. No distress.  Constant dry cough when speaking  Cardiovascular: Normal rate, regular rhythm, S1 normal, S2 normal, normal heart sounds and normal pulses.   No LE edema  Pulmonary/Chest: Effort normal and breath sounds normal.  Neurological: She is alert. GCS eye subscore is 4. GCS verbal subscore is 5. GCS motor subscore is 6.  Skin: Skin is warm, dry and intact.  Psychiatric: She has a normal mood and affect. Her speech is normal and behavior is normal.  Nursing note and vitals reviewed.  CXR pending - initial review shows no acute pulmonary disease  Assessment and Plan:    Richardine was seen today for establish care and asthma.  Diagnoses and all orders for this visit:  Persistent dry cough Suspected seasonal allergies with possible asthma. Uncontrolled symptoms with albuterol prn. Start scheduled ICS low dose inhaler of Flovent HFA. Follow-up in 4 weeks, sooner if needed. She declined flu shot today. Albuterol prn. She declined referral to asthma/allergy or pulmonary doctor at this time. Chest xray obtained - results pending.  -     DG Chest 2 View; Future  Other orders -     fluticasone (FLOVENT HFA) 44 MCG/ACT inhaler; Inhale 2 puffs into the lungs 2 (two) times daily.    . Reviewed expectations re: course of current medical issues. . Discussed self-management of symptoms. . Outlined signs and symptoms indicating need for more acute intervention. . Patient verbalized understanding and all questions were answered. . See orders for this visit as documented in the electronic medical record. . Patient received an After-Visit Summary.  CMA or LPN served as scribe during this visit. History,  Physical, and Plan performed by medical provider. Documentation and orders reviewed and attested to.  Inda Coke, PA-C

## 2017-02-03 ENCOUNTER — Telehealth: Payer: Self-pay | Admitting: Physician Assistant

## 2017-02-03 NOTE — Telephone Encounter (Signed)
Spoke to pt, told her Chest x-ray was normal, message was sent thru My Chart. Pt verbalized understanding. Pt said she is coughing a lot especially at night. Told pt needs to schedule an appt per Samantha's note if symptoms worsen. Pt verbalized understanding. Offered to schedule pt an appt now for tomorrow but pt said she has to check her schedule and will call back. Told pt okay.

## 2017-02-03 NOTE — Telephone Encounter (Signed)
Patient called in reference to chest xray results. Patient stated her cough is getting worse and wanted to see what she needs to do. Please call patient and advise. OK to leave message.

## 2017-02-11 NOTE — Telephone Encounter (Signed)
Patient would like a cough suppressant sent to pharm.  -LL

## 2017-02-12 NOTE — Telephone Encounter (Signed)
Please have patient try OTC Delsym twice daily.  I'm happy to see her sooner than Friday if symptoms worsen in the meantime.  Inda Coke PA-C 02/12/17

## 2017-02-12 NOTE — Telephone Encounter (Signed)
Left detailed message on personal voicemail Aldona Bar said to try OTC Delsym for cough and she would be happy to see you sooner than Friday if your symptoms worsen otherwise she will see you Friday. Any questions please call office.

## 2017-02-12 NOTE — Telephone Encounter (Signed)
Please see message and pt has an appointment on Friday for her cough.

## 2017-02-14 ENCOUNTER — Encounter: Payer: Self-pay | Admitting: Physician Assistant

## 2017-02-14 ENCOUNTER — Ambulatory Visit (INDEPENDENT_AMBULATORY_CARE_PROVIDER_SITE_OTHER): Payer: 59 | Admitting: Physician Assistant

## 2017-02-14 VITALS — BP 114/76 | HR 80 | Temp 98.6°F | Wt 223.6 lb

## 2017-02-14 DIAGNOSIS — R053 Chronic cough: Secondary | ICD-10-CM

## 2017-02-14 DIAGNOSIS — K219 Gastro-esophageal reflux disease without esophagitis: Secondary | ICD-10-CM | POA: Diagnosis not present

## 2017-02-14 DIAGNOSIS — R05 Cough: Secondary | ICD-10-CM

## 2017-02-14 MED ORDER — OMEPRAZOLE 20 MG PO CPDR: 20.0000 mg | DELAYED_RELEASE_CAPSULE | Freq: Every day | ORAL | 0 refills | Status: DC

## 2017-02-14 NOTE — Progress Notes (Signed)
Brianna Brooks is a 31 y.o. female here for a follow up of a pre-existing problem.  History of Present Illness:   Chief Complaint  Patient presents with  . Cough    X40month     HPI   Persistent cough --  Using Flovent twice daily. Albuterol 2 times every few hours. No fever, now has mucus production. Not taking any antihistamines. No changes to diet. No recent travel. No unusual HA or fatigue. Cough is worst during the day. Last saw me on 01/29/17 for this where we started Flovent, she is not really confident that this is working for her. She does have a history of GERD that she reports is typically treated with diet, was on medication "several years ago" for this. No abnormal calf pain, swelling, redness/warmth.   Past Medical History:  Diagnosis Date  . Abnormal Pap smear 06/2011   WAS TO HAVE COLPO;HAS NOT HAD YET, HAD + UPT  . Fibroid   . Headache(784.0)    MIGRAINES;USUALLY SLEEPS  . SVT (supraventricular tachycardia) (HCC)      Social History   Social History  . Marital status: Married    Spouse name: TERRENCE Dixson  . Number of children: 2  . Years of education: 12.5   Occupational History  . [employer]   Social History Main Topics  . Smoking status: Never Smoker  . Smokeless tobacco: Never Used  . Alcohol use No  . Drug use: No  . Sexual activity: Yes    Partners: Male    Birth control/ protection: None     Comment: PT STATES NO UNPROTECTED I/C X 14 DAYS   Other Topics Concern  . Not on file   Social History Narrative   Works at [employer]   3 children   In a relationship    Past Surgical History:  Procedure Laterality Date  . HYSTEROSCOPY  06/28/2011   IN OFFICE IUD REMOVAL WITH [facility]  . HYSTEROSCOPY    . NO PAST SURGERIES      Family History  Problem Relation Age of Onset  . Heart attack Maternal Grandmother   . Hypertension Maternal Grandmother   . Asthma Mother   . Cholelithiasis Mother   . Migraines Mother   .  Asthma Sister   . Asthma Brother   . Cancer Paternal Grandfather        COLON  . Hyperthyroidism Paternal Grandmother   . Heart disease Maternal Aunt        PACE MAKER  . Hypertension Maternal Aunt   . Hypertension Maternal Uncle   . Asthma Maternal Aunt   . Cancer Maternal Aunt        OVARIAN  . Cancer Maternal Aunt        OVARIAN  . Anesthesia problems Neg Hx     No Known Allergies  Current Medications:   Current Outpatient Prescriptions:  .  albuterol (PROVENTIL HFA;VENTOLIN HFA) 108 (90 Base) MCG/ACT inhaler, Inhale 2 puffs into the lungs every 6 (six) hours as needed for wheezing or shortness of breath (cough, shortness of breath or wheezing.)., Disp: 1 Inhaler, Rfl: 5 .  fluticasone (FLOVENT HFA) 44 MCG/ACT inhaler, Inhale 2 puffs into the lungs 2 (two) times daily., Disp: 1 Inhaler, Rfl: 1 .  levonorgestrel (MIRENA, 52 MG,) 20 MCG/24HR IUD, Mirena, Disp: , Rfl:  .  omeprazole (PRILOSEC) 20 MG capsule, Take 1 capsule (20 mg total) by mouth daily., Disp: 90 capsule, Rfl: 0  Review of Systems:   Review of Systems  Constitutional: Negative for chills, fever, malaise/fatigue and weight loss.  Respiratory: Positive for cough. Negative for shortness of breath.   Cardiovascular: Negative for chest pain, orthopnea, claudication and leg swelling.  Gastrointestinal: Negative for abdominal pain, diarrhea, heartburn, nausea and vomiting.  Skin: Negative for itching and rash.  Neurological: Negative for dizziness, tingling and headaches.    Vitals:   Vitals:   02/14/17 1511  BP: 114/76  Pulse: 80  Temp: 98.6 F (37 C)  TempSrc: Oral  SpO2: 97%  Weight: 223 lb 9.6 oz (101.4 kg)     Body mass index is 35.02 kg/m.  Physical Exam:   Physical Exam  Constitutional: She appears well-developed. She is cooperative.  Non-toxic appearance. She does not have a sickly appearance. She does not appear ill. No distress.  Dry, hacking cough throughout exam  HENT:  Head:  Normocephalic and atraumatic.  Right Ear: Tympanic membrane, external ear and ear canal normal. Tympanic membrane is not erythematous, not retracted and not bulging.  Left Ear: Tympanic membrane, external ear and ear canal normal. Tympanic membrane is not erythematous, not retracted and not bulging.  Nose: Nose normal. Right sinus exhibits no maxillary sinus tenderness and no frontal sinus tenderness. Left sinus exhibits no maxillary sinus tenderness and no frontal sinus tenderness.  Mouth/Throat: Uvula is midline. No posterior oropharyngeal edema or posterior oropharyngeal erythema.  Eyes: Conjunctivae and lids are normal.  Neck: Trachea normal.  Cardiovascular: Normal rate, regular rhythm, S1 normal, S2 normal and normal heart sounds.   Pulmonary/Chest: Effort normal and breath sounds normal. She has no decreased breath sounds. She has no wheezes. She has no rhonchi. She has no rales.  Musculoskeletal:  No tenderness with deep palpation of calves. No erythema or warmth appreciated.  Lymphadenopathy:    She has no cervical adenopathy.  Neurological: She is alert.  Skin: Skin is warm, dry and intact.  Psychiatric: She has a normal mood and affect. Her speech is normal and behavior is normal.  Nursing note and vitals reviewed.     Assessment and Plan:    Edgar was seen today for cough.  Diagnoses and all orders for this visit:  Persistent dry cough Discussed with Dr. Briscoe Deutscher. Start oral antihistamine and prilosec. I will also be sending patient to pulmonary to assess if upper airway cough syndrome vs asthma vs some other etiology. Avoiding systemic steroids for now given hx SVT. Reviewed red flags and return to clinic precautions.   Gastroesophageal reflux disease, esophagitis presence not specified Start prilosec. Continue to avoid trigger foods and laying down soon after eating.  Other orders -     omeprazole (PRILOSEC) 20 MG capsule; Take 1 capsule (20 mg total) by mouth  daily.    . Reviewed expectations re: course of current medical issues. . Discussed self-management of symptoms. . Outlined signs and symptoms indicating need for more acute intervention. . Patient verbalized understanding and all questions were answered. . See orders for this visit as documented in the electronic medical record. . Patient received an After-Visit Summary.  CMA or LPN served as scribe during this visit. History, Physical, and Plan performed by medical provider. Documentation and orders reviewed and attested to.  Inda Coke, PA-C

## 2017-02-14 NOTE — Patient Instructions (Addendum)
It was great seeing you!  Take 20 mg omeprazole daily.  Take antihistamine over the counter of your choice -- zyrtec, claritin or allegra daily.  You will be contacted about your referral to pulmonary.

## 2017-02-28 ENCOUNTER — Ambulatory Visit (INDEPENDENT_AMBULATORY_CARE_PROVIDER_SITE_OTHER): Payer: 59 | Admitting: Physician Assistant

## 2017-02-28 ENCOUNTER — Encounter: Payer: Self-pay | Admitting: Physician Assistant

## 2017-02-28 VITALS — BP 124/80 | HR 88 | Temp 98.4°F | Ht 67.0 in | Wt 224.2 lb

## 2017-02-28 DIAGNOSIS — R05 Cough: Secondary | ICD-10-CM | POA: Diagnosis not present

## 2017-02-28 DIAGNOSIS — R053 Chronic cough: Secondary | ICD-10-CM

## 2017-02-28 NOTE — Progress Notes (Signed)
Brianna Brooks is a 31 y.o. female is here to follow up on persistent cough.  I acted as a Education administrator for [employer], PA-C Anselmo Pickler, LPN  History of Present Illness:   Chief Complaint  Patient presents with  . Follow-up  . Cough    Cough  This is a recurrent problem. The current episode started 1 to 4 weeks ago. The problem has been waxing and waning. The problem occurs every few hours. The cough is productive of sputum (clear to yellow sputum). Pertinent negatives include no ear congestion. Associated symptoms comments: Dry persistent cough, slight sore throat end of day if not using cough drops.. Exacerbated by: worse during the day. She has tried steroid inhaler (cough drops, Robitussin) for the symptoms. The treatment provided mild relief.   Overall, symptoms are slowly improving. She has not been contacted by pulmonology for the referral that was put in our last visit together, 02/14/17.   There are no preventive care reminders to display for this patient.  Past Medical History:  Diagnosis Date  . Abnormal Pap smear 06/2011   WAS TO HAVE COLPO;HAS NOT HAD YET, HAD + UPT  . Fibroid   . Headache(784.0)    MIGRAINES;USUALLY SLEEPS  . SVT (supraventricular tachycardia) (HCC)      Social History   Social History  . Marital status: Married    Spouse name: TERRENCE Briddell  . Number of children: 2  . Years of education: 12.5   Occupational History  . [employer]   Social History Main Topics  . Smoking status: Never Smoker  . Smokeless tobacco: Never Used  . Alcohol use No  . Drug use: No  . Sexual activity: Yes    Partners: Male    Birth control/ protection: None     Comment: PT STATES NO UNPROTECTED I/C X 14 DAYS   Other Topics Concern  . Not on file   Social History Narrative   Works at [employer]   3 children   In a relationship    Past Surgical History:  Procedure Laterality Date  . HYSTEROSCOPY  06/28/2011   IN OFFICE IUD  REMOVAL WITH [facility]  . HYSTEROSCOPY    . NO PAST SURGERIES      Family History  Problem Relation Age of Onset  . Heart attack Maternal Grandmother   . Hypertension Maternal Grandmother   . Asthma Mother   . Cholelithiasis Mother   . Migraines Mother   . Asthma Sister   . Asthma Brother   . Cancer Paternal Grandfather        COLON  . Hyperthyroidism Paternal Grandmother   . Heart disease Maternal Aunt        PACE MAKER  . Hypertension Maternal Aunt   . Hypertension Maternal Uncle   . Asthma Maternal Aunt   . Cancer Maternal Aunt        OVARIAN  . Cancer Maternal Aunt        OVARIAN  . Anesthesia problems Neg Hx     PMHx, SurgHx, SocialHx, FamHx, Medications, and Allergies were reviewed in the Visit Navigator and updated as appropriate.   Patient Active Problem List   Diagnosis Date Noted  . Bronchospasm 01/05/2014  . Persistent dry cough 02/14/2012  . Sickle cell trait ([place]) 09/22/2011  . 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 GERD 04/18/2010  . HEADACHE 04/18/2010    Social History  Substance Use Topics  . Smoking status: Never Smoker  .  Smokeless tobacco: Never Used  . Alcohol use No    Current Medications and Allergies:    Current Outpatient Prescriptions:  .  albuterol (PROVENTIL HFA;VENTOLIN HFA) 108 (90 Base) MCG/ACT inhaler, Inhale 2 puffs into the lungs every 6 (six) hours as needed for wheezing or shortness of breath (cough, shortness of breath or wheezing.)., Disp: 1 Inhaler, Rfl: 5 .  fluticasone (FLOVENT HFA) 44 MCG/ACT inhaler, Inhale 2 puffs into the lungs 2 (two) times daily., Disp: 1 Inhaler, Rfl: 1 .  levonorgestrel (MIRENA, 52 MG,) 20 MCG/24HR IUD, Mirena, Disp: , Rfl:  .  omeprazole (PRILOSEC) 20 MG capsule, Take 1 capsule (20 mg total) by mouth daily., Disp: 90 capsule, Rfl: 0  No Known Allergies  Review of Systems   Review of Systems  Negative unless otherwise specified HPI.  Vitals:   Vitals:   02/28/17 1558  BP: 124/80  Pulse: 88    Temp: 98.4 F (36.9 C)  TempSrc: Oral  SpO2: 96%  Weight: 224 lb 4 oz (101.7 kg)  Height: 5\' 7"  (1.702 m)     Body mass index is 35.12 kg/m.   Physical Exam:    Physical Exam  Constitutional: She appears well-developed. She is cooperative.  Non-toxic appearance. She does not have a sickly appearance. She does not appear ill. No distress.  While talking, patient often has frequent dry, coughing attacks  Cardiovascular: Normal rate, regular rhythm, S1 normal, S2 normal, normal heart sounds and normal pulses.   No LE edema  Pulmonary/Chest: Effort normal and breath sounds normal. No accessory muscle usage. No tachypnea. No respiratory distress.  Neurological: She is alert. GCS eye subscore is 4. GCS verbal subscore is 5. GCS motor subscore is 6.  Skin: Skin is warm, dry and intact.  Psychiatric: She has a normal mood and affect. Her speech is normal and behavior is normal.  Nursing note and vitals reviewed.      Assessment and Plan:    Brianna Brooks was seen today for follow-up and cough.  Diagnoses and all orders for this visit:  Persistent dry cough Continue current measures including: antihistamine, prilosec, flovent and prn albuterol. Chest xray from prior visit is negative. Suspect GERD vs upper airway cough syndrome vs possible allergen. Referral to pulmonary pending. Discussed warning signs and when to return to clinic.   Marland Kitchen Reviewed expectations re: course of current medical issues. . Discussed self-management of symptoms. . Outlined signs and symptoms indicating need for more acute intervention. . Patient verbalized understanding and all questions were answered. . See orders for this visit as documented in the electronic medical record. . Patient received an After Visit Summary.  CMA or LPN served as scribe during this visit. History, Physical, and Plan performed by medical provider. Documentation and orders reviewed and attested to.  Inda Coke, PA-C [place],  [place] 02/28/2017  Follow-up: Return if symptoms worsen or fail to improve.

## 2017-02-28 NOTE — Patient Instructions (Addendum)
It was great to see you.  Please call [facility] (and we will too) on Monday to check on the status of your referral.  If your cough changes in any way, please let us know.  [facility]: [phone]  Continue inhalers, zyrtec and prilosec.

## 2017-03-19 ENCOUNTER — Ambulatory Visit (INDEPENDENT_AMBULATORY_CARE_PROVIDER_SITE_OTHER): Payer: 59 | Admitting: Pulmonary Disease

## 2017-03-19 ENCOUNTER — Other Ambulatory Visit (INDEPENDENT_AMBULATORY_CARE_PROVIDER_SITE_OTHER): Payer: 59

## 2017-03-19 ENCOUNTER — Encounter: Payer: Self-pay | Admitting: Pulmonary Disease

## 2017-03-19 VITALS — BP 122/78 | HR 100 | Ht 67.0 in | Wt 224.2 lb

## 2017-03-19 DIAGNOSIS — R05 Cough: Secondary | ICD-10-CM

## 2017-03-19 DIAGNOSIS — R059 Cough, unspecified: Secondary | ICD-10-CM

## 2017-03-19 LAB — CBC WITH DIFFERENTIAL/PLATELET
BASOS ABS: 0 10*3/uL (ref 0.0–0.1)
Basophils Relative: 0.5 % (ref 0.0–3.0)
Eosinophils Absolute: 0 10*3/uL (ref 0.0–0.7)
Eosinophils Relative: 0.7 % (ref 0.0–5.0)
HCT: 40.6 % (ref 36.0–46.0)
HEMOGLOBIN: 13.3 g/dL (ref 12.0–15.0)
Lymphocytes Relative: 40.6 % (ref 12.0–46.0)
Lymphs Abs: 2.7 10*3/uL (ref 0.7–4.0)
MCHC: 32.7 g/dL (ref 30.0–36.0)
MCV: 80.4 fl (ref 78.0–100.0)
MONO ABS: 0.4 10*3/uL (ref 0.1–1.0)
MONOS PCT: 5.6 % (ref 3.0–12.0)
NEUTROS PCT: 52.6 % (ref 43.0–77.0)
Neutro Abs: 3.5 10*3/uL (ref 1.4–7.7)
Platelets: 325 10*3/uL (ref 150.0–400.0)
RBC: 5.04 Mil/uL (ref 3.87–5.11)
RDW: 13.2 % (ref 11.5–15.5)
WBC: 6.7 10*3/uL (ref 4.0–10.5)

## 2017-03-19 LAB — NITRIC OXIDE: NITRIC OXIDE: 5

## 2017-03-19 NOTE — Patient Instructions (Addendum)
We will start you on chlorpheniranime 8 mg 3 times daily and Astelin nasal spray Continue the albuterol inhaler, Flovent inhaler and Prilosec We will check pulmonary function test, CBC with differential and blood allergy profile Follow-up in 1 month.

## 2017-03-19 NOTE — Progress Notes (Signed)
Brianna Brooks    [id number]    November 03, 1985  Primary Care Physician:[place], Aldona Bar, [place]  Referring Physician: Inda Coke, [address], [place] [id number]  Chief complaint: Consult for cough  HPI: 31 year old with history of SVT, migraine presents with cough for the past 1 month.  She has white mucus production, no fevers, chills.  Denies any dyspnea, wheezing. She has been given albuterol and Flovent inhalers by her primary care and is on Prilosec for empiric treatment for GERD.  She feels that this has not improved her breathing.  She has had similar episodes once a year which lasts for a month.  Denies any allergies, rhinitis, heartburn. She snores occasionally, denies any daytime sleepiness.  Pets: None Occupation: Therapist, art for [employer] Exposures: No known exposures.  No mold at home.  Has clean carpets, forced air heating with regular change of filters Smoking history: Never smoked Travel History: No recent travel  Outpatient Encounter Prescriptions as of 03/19/2017  Medication Sig  . albuterol (PROVENTIL HFA;VENTOLIN HFA) 108 (90 Base) MCG/ACT inhaler Inhale 2 puffs into the lungs every 6 (six) hours as needed for wheezing or shortness of breath (cough, shortness of breath or wheezing.).  Marland Kitchen fluticasone (FLOVENT HFA) 44 MCG/ACT inhaler Inhale 2 puffs into the lungs 2 (two) times daily.  Marland Kitchen levonorgestrel (MIRENA, 52 MG,) 20 MCG/24HR IUD Mirena  . omeprazole (PRILOSEC) 20 MG capsule Take 1 capsule (20 mg total) by mouth daily.   No facility-administered encounter medications on file as of 03/19/2017.     Allergies as of 03/19/2017  . (No Known Allergies)    Past Medical History:  Diagnosis Date  . Abnormal Pap smear 06/2011   WAS TO HAVE COLPO;HAS NOT HAD YET, HAD + UPT  . Fibroid   . Headache(784.0)    MIGRAINES;USUALLY SLEEPS  . SVT (supraventricular tachycardia) (HCC)     Past Surgical History:  Procedure Laterality  Date  . HYSTEROSCOPY  06/28/2011   IN OFFICE IUD REMOVAL WITH [facility]  . HYSTEROSCOPY    . NO PAST SURGERIES      Family History  Problem Relation Age of Onset  . Heart attack Maternal Grandmother   . Hypertension Maternal Grandmother   . Asthma Mother   . Cholelithiasis Mother   . Migraines Mother   . Asthma Sister   . Asthma Brother   . Cancer Paternal Grandfather        COLON  . Hyperthyroidism Paternal Grandmother   . Heart disease Maternal Aunt        PACE MAKER  . Hypertension Maternal Aunt   . Hypertension Maternal Uncle   . Asthma Maternal Aunt   . Cancer Maternal Aunt        OVARIAN  . Cancer Maternal Aunt        OVARIAN  . Anesthesia problems Neg Hx     Social History   Social History  . Marital status: Married    Spouse name: TERRENCE Kirst  . Number of children: 2  . Years of education: 12.5   Occupational History  . [employer]   Social History Main Topics  . Smoking status: Never Smoker  . Smokeless tobacco: Never Used  . Alcohol use No  . Drug use: No  . Sexual activity: Yes    Partners: Male    Birth control/ protection: None     Comment: PT STATES NO UNPROTECTED I/C X 14 DAYS  Other Topics Concern  . Not on file   Social History Narrative   Works at [employer]   3 children   In a relationship    Review of systems: Review of Systems  Constitutional: Negative for fever and chills.  HENT: Negative.   Eyes: Negative for blurred vision.  Respiratory: as per HPI  Cardiovascular: Negative for chest pain and palpitations.  Gastrointestinal: Negative for vomiting, diarrhea, blood per rectum. Genitourinary: Negative for dysuria, urgency, frequency and hematuria.  Musculoskeletal: Negative for myalgias, back pain and joint pain.  Skin: Negative for itching and rash.  Neurological: Negative for dizziness, tremors, focal weakness, seizures and loss of consciousness.  Endo/Heme/Allergies: Negative for environmental  allergies.  Psychiatric/Behavioral: Negative for depression, suicidal ideas and hallucinations.  All other systems reviewed and are negative.  Physical Exam: Blood pressure 122/78, pulse 100, height 5\' 7"  (1.702 m), weight 224 lb 3.2 oz (101.7 kg), SpO2 97 %. Gen:      No acute distress HEENT:  EOMI, sclera anicteric Neck:     No masses; no thyromegaly Lungs:    Clear to auscultation bilaterally; normal respiratory effort CV:         Regular rate and rhythm; no murmurs Abd:      + bowel sounds; soft, non-tender; no palpable masses, no distension Ext:    No edema; adequate peripheral perfusion Skin:      Warm and dry; no rash Neuro: alert and oriented x 3 Psych: normal mood and affect  Data Reviewed: FENO 03/19/17- 5  Chest x-ray 01/29/17-no acute cardiopulmonary abnormality I have reviewed the images personally.  Assessment:  Evaluation for cough Suspect upper airway cough syndrome from postnasal drip and silent reflux. Suspicion for asthma is low given low FENO For postnasal drip we will start chlorpheniramine antihistamine and Astelin nasal spray She will continue on her inhalers including albuterol, Flonase for now. Continue Prilosec for empiric treatment of GERD.  Evaluate with PFTs with bronchodilator response, CBC differential and blood allergy profile.  Plan/Recommendations: - Chlorpheniramine 8 mg 3 times daily and Astelin nasal spray - Continue inhalers Prilosec - Check PFTs, CBC differential, blood allergy profile.  Marshell Garfinkel MD [facility] Pager [phone] 03/19/2017, 12:11 PM  CC: Inda Coke, [place]

## 2017-03-20 LAB — RESPIRATORY ALLERGY PROFILE REGION II ~~LOC~~
Allergen, A. alternata, m6: <0.1 kU/L
Allergen, Comm Silver Birch, t9: <0.1 kU/L
Allergen, Cottonwood, t14: <0.1 kU/L
Allergen, D pternoyssinus,d7: <0.1 kU/L
Allergen, Mulberry, t76: <0.1 kU/L
Allergen, Oak,t7: <0.1 kU/L
Aspergillus fumigatus, m3: <0.1 kU/L
Bermuda Grass: <0.1 kU/L
Box Elder IgE: <0.1 kU/L
CLADOSPORIUM HERBARUM (M2) IGE: <0.1 kU/L
CLASS: 0
CLASS: 0
CLASS: 0
CLASS: 0
CLASS: 0
CLASS: 0
CLASS: 0
CLASS: 0
CLASS: 0
CLASS: 0
CLASS: 0
CLASS: 0
COMMON RAGWEED (SHORT) (W1) IGE: <0.1 kU/L
Class: 0
Class: 0
Class: 0
Class: 0
Class: 0
Class: 0
Class: 0
Class: 0
Class: 0
Class: 0
Class: 0
Class: 0
Cockroach: <0.1 kU/L
Dog Dander: <0.1 kU/L
IGE (IMMUNOGLOBULIN E), SERUM: 14 kU/L (ref <=114)
Pecan/Hickory Tree IgE: <0.1 kU/L
Rough Pigweed  IgE: <0.1 kU/L
Sheep Sorrel IgE: <0.1 kU/L

## 2017-03-20 LAB — INTERPRETATION:

## 2017-04-29 ENCOUNTER — Ambulatory Visit: Payer: 59 | Admitting: Pulmonary Disease

## 2017-12-04 ENCOUNTER — Encounter: Payer: Self-pay | Admitting: *Deleted

## 2018-08-05 ENCOUNTER — Other Ambulatory Visit: Payer: Self-pay

## 2018-08-05 ENCOUNTER — Ambulatory Visit (INDEPENDENT_AMBULATORY_CARE_PROVIDER_SITE_OTHER): Payer: 59 | Admitting: Physician Assistant

## 2018-08-05 ENCOUNTER — Encounter: Payer: Self-pay | Admitting: Physician Assistant

## 2018-08-05 ENCOUNTER — Telehealth: Payer: Self-pay | Admitting: *Deleted

## 2018-08-05 DIAGNOSIS — R05 Cough: Secondary | ICD-10-CM | POA: Diagnosis not present

## 2018-08-05 DIAGNOSIS — R059 Cough, unspecified: Secondary | ICD-10-CM

## 2018-08-05 MED ORDER — ALBUTEROL SULFATE HFA 108 (90 BASE) MCG/ACT IN AERS: 2.0000 | INHALATION_SPRAY | Freq: Four times a day (QID) | RESPIRATORY_TRACT | 5 refills | Status: AC | PRN

## 2018-08-05 MED ORDER — OMEPRAZOLE 20 MG PO CPDR: 20.0000 mg | DELAYED_RELEASE_CAPSULE | Freq: Every day | ORAL | 3 refills | Status: AC

## 2018-08-05 MED ORDER — AZELASTINE HCL 0.1 % NA SOLN: 1.0000 | Freq: Two times a day (BID) | NASAL | 12 refills | Status: AC

## 2018-08-05 MED ORDER — CHLORPHENIRAMINE MALEATE 4 MG PO TABS: 4.0000 mg | ORAL_TABLET | Freq: Three times a day (TID) | ORAL | 1 refills | Status: AC

## 2018-08-05 MED ORDER — FLUTICASONE PROPIONATE HFA 44 MCG/ACT IN AERO: 2.0000 | INHALATION_SPRAY | Freq: Two times a day (BID) | RESPIRATORY_TRACT | 1 refills | Status: DC

## 2018-08-05 NOTE — Patient Instructions (Addendum)
It was great to see you!  Please restart all meds: Chlorpheniranime 4 mg two times a day Astelin nasal spray Albuterol inhaler as needed Flovent inhaler scheduled Prilosec daily  Please call and make an appointment with pulmonary to follow-up with them.  Stay well!  Aldona Bar

## 2018-08-05 NOTE — Progress Notes (Signed)
Brianna Brooks is a 33 y.o. female here for a follow up of a pre-existing problem.  I acted as a Education administrator for [employer], PA-C Anselmo Pickler, LPN  History of Present Illness:   Chief Complaint  Patient presents with  . Cough    Cough  This is a chronic problem. Episode onset: Started Feb 25th. The problem has been gradually worsening. The problem occurs constantly. The cough is non-productive (except first thing in the morning coughs up yellow/ orange.). Associated symptoms include nasal congestion, postnasal drip and shortness of breath. Pertinent negatives include no chills, fever, headaches, myalgias or sore throat. The symptoms are aggravated by lying down. She has tried OTC cough suppressant (Delsym) for the symptoms. The treatment provided no relief.   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 She was started on chlorpheniramine, astelin, prilosec and continued on albuterol and flovent. She stopped all medications after she was feeling better. She was supposed to follow up with pulmonary but her appointment was canceled due to weather and she hasn't follow-up yet.  Past Medical History:  Diagnosis Date  . Abnormal Pap smear 06/2011   WAS TO HAVE COLPO;HAS NOT HAD YET, HAD + UPT  . Fibroid   . Headache(784.0)    MIGRAINES;USUALLY SLEEPS  . SVT (supraventricular tachycardia) (HCC)      Social History   Socioeconomic History  . Marital status: Married    Spouse name: TERRENCE Prell  . Number of children: 2  . Years of education: 12.5  . Highest education level: Not on file  Occupational History  . Occupation: Engineer, maintenance: [employer]  . Financial resource strain: Not on file  . Food insecurity:    Worry: Not on file    Inability: Not on file  . Transportation needs:    Medical: Not on file    Non-medical: Not on file  Tobacco Use  . Smoking status: Never Smoker  . Smokeless tobacco:  Never Used  Substance and Sexual Activity  . Alcohol use: No  . Drug use: No  . Sexual activity: Yes    Partners: Male    Birth control/protection: None    Comment: PT STATES NO UNPROTECTED I/C X 14 DAYS  Lifestyle  . Physical activity:    Days per week: Not on file    Minutes per session: Not on file  . Stress: Not on file  Relationships  . Social connections:    Talks on phone: Not on file    Gets together: Not on file    Attends religious service: Not on file    Active member of club or organization: Not on file    Attends meetings of clubs or organizations: Not on file    Relationship status: Not on file  . Intimate partner violence:    Fear of current or ex partner: Not on file    Emotionally abused: Not on file    Physically abused: Not on file    Forced sexual activity: Not on file  Other Topics Concern  . Not on file  Social History Narrative   Works at [employer]   3 children   In a relationship    Past Surgical History:  Procedure Laterality Date  . HYSTEROSCOPY  06/28/2011   IN OFFICE IUD REMOVAL WITH [facility]  . HYSTEROSCOPY    . NO PAST SURGERIES      Family History  Problem Relation Age of Onset  . Heart attack Maternal Grandmother   . Hypertension Maternal Grandmother   . Asthma Mother   . Cholelithiasis Mother   . Migraines Mother   . Asthma Sister   . Asthma Brother   . Cancer Paternal Grandfather        COLON  . Hyperthyroidism Paternal Grandmother   . Heart disease Maternal Aunt        PACE MAKER  . Hypertension Maternal Aunt   . Hypertension Maternal Uncle   . Asthma Maternal Aunt   . Cancer Maternal Aunt        OVARIAN  . Cancer Maternal Aunt        OVARIAN  . Anesthesia problems Neg Hx     No Known Allergies  Current Medications:   Current Outpatient Medications:  .  albuterol (PROVENTIL HFA;VENTOLIN HFA) 108 (90 Base) MCG/ACT inhaler, Inhale 2 puffs into the lungs every 6 (six) hours as needed for wheezing or shortness of  breath (cough, shortness of breath or wheezing.)., Disp: 1 Inhaler, Rfl: 5 .  azelastine (ASTELIN) 0.1 % nasal spray, Place 1 spray into both nostrils 2 (two) times daily. Use in each nostril as directed, Disp: 30 mL, Rfl: 12 .  chlorpheniramine (CHLOR-TRIMETON) 4 MG tablet, Take 1 tablet (4 mg total) by mouth 3 (three) times daily., Disp: 84 tablet, Rfl: 1 .  fluticasone (FLOVENT HFA) 44 MCG/ACT inhaler, Inhale 2 puffs into the lungs 2 (two) times daily., Disp: 1 Inhaler, Rfl: 1 .  omeprazole (PRILOSEC) 20 MG capsule, Take 1 capsule (20 mg total) by mouth daily., Disp: 30 capsule, Rfl: 3   Review of Systems:   Review of Systems  Constitutional: Negative for chills and fever.  HENT: Positive for postnasal drip. Negative for sore throat.   Respiratory: Positive for cough and shortness of breath.   Musculoskeletal: Negative for myalgias.  Neurological: Negative for headaches.    Vitals:   Vitals:   08/05/18 1257  BP: 120/78  Pulse: 97  Temp: 98.8 F (37.1 C)  TempSrc: Oral  SpO2: 96%  Weight: 235 lb (106.6 kg)  Height: 5\' 7"  (1.702 m)     Body mass index is 36.81 kg/m.  Physical Exam:   Physical Exam Vitals signs and nursing note reviewed.  Constitutional:      General: She is not in acute distress.    Appearance: She is well-developed. She is not ill-appearing or toxic-appearing.  HENT:     Head: Normocephalic and atraumatic.     Right Ear: Tympanic membrane, ear canal and external ear normal. Tympanic membrane is not erythematous, retracted or bulging.     Left Ear: Tympanic membrane, ear canal and external ear normal. Tympanic membrane is not erythematous, retracted or bulging.     Nose: Nose normal.     Right Sinus: No maxillary sinus tenderness or frontal sinus tenderness.     Left Sinus: No maxillary sinus tenderness or frontal sinus tenderness.     Mouth/Throat:     Pharynx: Uvula midline. No posterior oropharyngeal erythema.  Eyes:     General: Lids are normal.      Conjunctiva/sclera: Conjunctivae normal.  Neck:     Trachea: Trachea normal.  Cardiovascular:     Rate and Rhythm: Normal rate and regular rhythm.     Heart sounds: Normal heart sounds, S1 normal and S2 normal.  Pulmonary:     Effort: Pulmonary effort is normal.     Breath sounds: Normal breath sounds.  No decreased breath sounds, wheezing, rhonchi or rales.     Comments: Frequent dry, spasm like cough while speaking Lymphadenopathy:     Cervical: No cervical adenopathy.  Skin:    General: Skin is warm and dry.  Neurological:     Mental Status: She is alert.  Psychiatric:        Speech: Speech normal.        Behavior: Behavior normal. Behavior is cooperative.      Assessment and Plan:   Addylin was seen today for cough.  Diagnoses and all orders for this visit:  Cough Restart all medications previously prescribed. Follow-up with pulmonary soon to reassess treatment. No indication for abx at this time. Follow-up if symptoms persist or change. -     albuterol (PROVENTIL HFA;VENTOLIN HFA) 108 (90 Base) MCG/ACT inhaler; Inhale 2 puffs into the lungs every 6 (six) hours as needed for wheezing or shortness of breath (cough, shortness of breath or wheezing.).  Other orders -     fluticasone (FLOVENT HFA) 44 MCG/ACT inhaler; Inhale 2 puffs into the lungs 2 (two) times daily. -     omeprazole (PRILOSEC) 20 MG capsule; Take 1 capsule (20 mg total) by mouth daily. -     azelastine (ASTELIN) 0.1 % nasal spray; Place 1 spray into both nostrils 2 (two) times daily. Use in each nostril as directed -     chlorpheniramine (CHLOR-TRIMETON) 4 MG tablet; Take 1 tablet (4 mg total) by mouth 3 (three) times daily.  . Reviewed expectations re: course of current medical issues. . Discussed self-management of symptoms. . Outlined signs and symptoms indicating need for more acute intervention. . Patient verbalized understanding and all questions were answered. . See orders for this visit as  documented in the electronic medical record. . Patient received an After-Visit Summary.  CMA or LPN served as scribe during this visit. History, Physical, and Plan performed by medical provider. The above documentation has been reviewed and is accurate and complete.  Inda Coke, PA-C

## 2018-08-05 NOTE — Telephone Encounter (Signed)
Questions for Screening COVID-19  Symptom onset: Pt has had a chronic cough since Feb 25th, slight SOB with coughing.  Travel or Contacts: Pt has not traveled  During this illness, did/does the patient experience any of the following symptoms? Fever >100.54F []   Yes [x]   No []   Unknown Subjective fever (felt feverish) []   Yes [x]   No []   Unknown Chills []   Yes [x]   No []   Unknown Muscle aches (myalgia) []   Yes [x]   No []   Unknown Runny nose (rhinorrhea) [x]   Yes []   No []   Unknown Sore throat []   Yes [x]   No []   Unknown Cough (new onset or worsening of chronic cough) [x]   Yes []   No []   Unknown Shortness of breath (dyspnea) [x]   Yes []   No []   Unknown Nausea or vomiting []   Yes [x]   No []   Unknown Headache []   Yes [x]   No []   Unknown Abdominal pain  []   Yes [x]   No []   Unknown Diarrhea (?3 loose/looser than normal stools/24hr period) []   Yes [x]   No []   Unknown Other, specify:_____________________________________________   Patient risk factors: Smoker? []   Current []   Former [x]   Never If female, currently pregnant? []   Yes [x]   No  Patient Active Problem List   Diagnosis Date Noted  . Bronchospasm 01/05/2014  . Persistent dry cough 02/14/2012  . Sickle cell trait ([place]) 09/22/2011  . 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 GERD 04/18/2010  . HEADACHE 04/18/2010    Plan:  []   High risk for COVID-19 with red flags go to ED (with CP, SOB, weak/lightheaded, or fever > 101.5). Call ahead.  []   High risk for COVID-19 but stable will have car visit. Inform provider and coordinate time. Will be completed in afternoon. [x]   No red flags but URI signs or symptoms will go through side door and be seen in dedicated room.  Note: Referral to telemedicine is an appropriate alternative disposition for higher risk but stable. [facility]/e-Visit: [phone].

## 2018-08-14 ENCOUNTER — Telehealth: Payer: Self-pay | Admitting: *Deleted

## 2018-08-14 NOTE — Telephone Encounter (Signed)
Called pt and left vm to schedule webex.  °

## 2018-08-14 NOTE — Telephone Encounter (Signed)
Left message on voicemail to call office. Question about FMLA paperwork dropped off.

## 2018-08-14 NOTE — Telephone Encounter (Signed)
Please contact pt to schedule Webex on Monday with Samantha.

## 2018-08-14 NOTE — Telephone Encounter (Signed)
Spoke to pt regarding FMLA paperwork explained to pt we can not fill out due to not being seen on days out of work. Asked pt if she saw Pulmonary yet? Pt said no she has not received a call about rescheduling an appt. Told pt Aldona Bar said she would need to do a Webex with you to discuss. Pt verbalized understanding. Told pt scheduler will contact her to schedule for Monday. Pt verbalized understanding.

## 2018-08-24 ENCOUNTER — Encounter: Payer: Self-pay | Admitting: Physician Assistant

## 2018-08-24 ENCOUNTER — Ambulatory Visit (INDEPENDENT_AMBULATORY_CARE_PROVIDER_SITE_OTHER): Payer: 59 | Admitting: Physician Assistant

## 2018-08-24 DIAGNOSIS — Z0289 Encounter for other administrative examinations: Secondary | ICD-10-CM

## 2018-08-24 DIAGNOSIS — J9801 Acute bronchospasm: Secondary | ICD-10-CM

## 2018-08-24 NOTE — Progress Notes (Signed)
Virtual Visit via Video   I connected with Brianna Brooks on 08/24/18 at  2:00 PM EDT by a video enabled telemedicine application and verified that I am speaking with the correct person using two identifiers. Location patient: Home Location provider: [employer], Office Persons participating in the virtual visit: Brianna Brooks, [place], [place], [place], [place]  I discussed the limitations of evaluation and management by telemedicine and the availability of in person appointments. The patient expressed understanding and agreed to proceed.  Subjective:   HPI:  Chronic cough Pt is needing FMLA paperwork filled out so she does not get penalized for being out of work due to ongoing doctor's appointments for cough, or coughing exacerbations.  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   She has been seen by pulmonology in the past and diagnosed with bronchospasm. She is currently on chlorpheniramine, prilosec, astelin nasal spray, flovent and albuterol prn.  Cough has improved since last visit with me.  ROS: See pertinent positives and negatives per HPI.  Patient Active Problem List   Diagnosis Date Noted  . Bronchospasm 01/05/2014  . Persistent dry cough 02/14/2012  . Sickle cell trait ([place]) 09/22/2011  . 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 GERD 04/18/2010  . HEADACHE 04/18/2010    Social History   Tobacco Use  . Smoking status: Never Smoker  . Smokeless tobacco: Never Used  Substance Use Topics  . Alcohol use: No    Current Outpatient Medications:  .  albuterol (PROVENTIL HFA;VENTOLIN HFA) 108 (90 Base) MCG/ACT inhaler, Inhale 2 puffs into the lungs every 6 (six) hours as needed for wheezing or shortness of breath (cough, shortness of breath or wheezing.)., Disp: 1 Inhaler, Rfl: 5 .  azelastine (ASTELIN) 0.1 % nasal spray, Place 1 spray into both nostrils 2 (two) times daily. Use in each nostril as directed, Disp: 30 mL, Rfl:  12 .  chlorpheniramine (CHLOR-TRIMETON) 4 MG tablet, Take 1 tablet (4 mg total) by mouth 3 (three) times daily., Disp: 84 tablet, Rfl: 1 .  fluticasone (FLOVENT HFA) 44 MCG/ACT inhaler, Inhale 2 puffs into the lungs 2 (two) times daily., Disp: 1 Inhaler, Rfl: 1 .  omeprazole (PRILOSEC) 20 MG capsule, Take 1 capsule (20 mg total) by mouth daily., Disp: 30 capsule, Rfl: 3  No Known Allergies  Objective:   VITALS: Per patient if applicable, see vitals. GENERAL: Alert, appears well and in no acute distress. HEENT: Atraumatic, conjunctiva clear, no obvious abnormalities on inspection of external nose and ears. NECK: Normal movements of the head and neck. CARDIOPULMONARY: No increased WOB. Speaking in clear sentences. I:E ratio WNL.  MS: Moves all visible extremities without noticeable abnormality. PSYCH: Pleasant and cooperative, well-groomed. Speech normal rate and rhythm. Affect is appropriate. Insight and judgement are appropriate. Attention is focused, linear, and appropriate.  NEURO: CN grossly intact. Oriented as arrived to appointment on time with no prompting. Moves both UE equally.  SKIN: No obvious lesions, wounds, erythema, or cyanosis noted on face or hands.  Assessment and Plan:   Brianna Brooks was seen today for discuss fmla.  Diagnoses and all orders for this visit:  Bronchospasm; Encounter for completion of form with patient Papers completed. Patient would like 8 hours per month available if needed for coughing flares and appointments if needed. I think this is reasonable. Follow-up with pulmonology.   . Reviewed expectations re: course of current medical issues. . Discussed self-management of symptoms. . Outlined signs and  symptoms indicating need for more acute intervention. . Patient verbalized understanding and all questions were answered. Marland Kitchen Health Maintenance issues including appropriate healthy diet, exercise, and smoking avoidance were discussed with patient. . See orders  for this visit as documented in the electronic medical record.  I discussed the assessment and treatment plan with the patient. The patient was provided an opportunity to ask questions and all were answered. The patient agreed with the plan and demonstrated an understanding of the instructions.   The patient was advised to call back or seek an in-person evaluation if the symptoms worsen or if the condition fails to improve as anticipated.  CMA or LPN served as scribe during this visit. History, Physical, and Plan performed by medical provider. The above documentation has been reviewed and is accurate and complete.   [place], [place] 08/24/2018

## 2018-10-05 ENCOUNTER — Other Ambulatory Visit: Payer: Self-pay | Admitting: Physician Assistant

## 2019-09-24 ENCOUNTER — Encounter: Payer: 59 | Admitting: Physician Assistant

## 2019-10-22 ENCOUNTER — Encounter: Payer: 59 | Admitting: Physician Assistant

## 2019-10-27 ENCOUNTER — Encounter: Payer: 59 | Admitting: Physician Assistant

## 2019-10-27 DIAGNOSIS — Z0289 Encounter for other administrative examinations: Secondary | ICD-10-CM

## 2019-10-29 ENCOUNTER — Encounter: Payer: Self-pay | Admitting: Physician Assistant

## 2022-02-11 ENCOUNTER — Encounter: Payer: Self-pay | Admitting: *Deleted

## 2022-05-02 ENCOUNTER — Encounter: Payer: Self-pay | Admitting: *Deleted

## 2023-05-26 ENCOUNTER — Other Ambulatory Visit: Payer: Self-pay | Admitting: Obstetrics and Gynecology

## 2023-05-26 DIAGNOSIS — Z1231 Encounter for screening mammogram for malignant neoplasm of breast: Secondary | ICD-10-CM

## 2023-06-30 ENCOUNTER — Ambulatory Visit: Payer: 59

## 2023-07-07 ENCOUNTER — Other Ambulatory Visit: Payer: Self-pay

## 2023-07-07 ENCOUNTER — Telehealth: Payer: Self-pay

## 2023-07-07 DIAGNOSIS — D509 Iron deficiency anemia, unspecified: Secondary | ICD-10-CM | POA: Insufficient documentation

## 2023-07-07 NOTE — Telephone Encounter (Signed)
 [place], patient will be scheduled as soon as possible.  Auth Submission: NO AUTH NEEDED Site of care: Site of care: [facility] Payer: [employer] commercial Medication & CPT/J Code(s) submitted: Venofer (Iron Sucrose) J1756 Route of submission (phone, fax, portal):  Phone # Fax # Auth type: Buy/Bill PB Units/visits requested: 500mg  x 2 doses Reference number:  Approval from: 07/07/23 to 01/04/24

## 2023-07-08 ENCOUNTER — Ambulatory Visit: Payer: 59

## 2023-07-16 ENCOUNTER — Encounter: Payer: Self-pay | Admitting: Obstetrics and Gynecology

## 2023-07-16 ENCOUNTER — Ambulatory Visit
Admission: RE | Admit: 2023-07-16 | Discharge: 2023-07-16 | Disposition: A | Discharge status: Home / Self Care | Payer: 59 | Source: Ambulatory Visit | Attending: Obstetrics and Gynecology | Admitting: Obstetrics and Gynecology

## 2023-07-16 DIAGNOSIS — Z1231 Encounter for screening mammogram for malignant neoplasm of breast: Secondary | ICD-10-CM

## 2023-07-21 ENCOUNTER — Ambulatory Visit: Payer: 59

## 2023-07-21 ENCOUNTER — Other Ambulatory Visit: Payer: Self-pay | Admitting: Obstetrics and Gynecology

## 2023-07-21 VITALS — BP 127/89 | HR 85 | Temp 98.3°F | Resp 20 | Ht 67.0 in | Wt 214.6 lb

## 2023-07-21 DIAGNOSIS — R928 Other abnormal and inconclusive findings on diagnostic imaging of breast: Secondary | ICD-10-CM

## 2023-07-21 DIAGNOSIS — T454X5A Adverse effect of iron and its compounds, initial encounter: Secondary | ICD-10-CM

## 2023-07-21 DIAGNOSIS — D509 Iron deficiency anemia, unspecified: Secondary | ICD-10-CM | POA: Diagnosis not present

## 2023-07-21 MED ORDER — FAMOTIDINE IN NACL 20-0.9 MG/50ML-% IV SOLN
20.0000 mg | Freq: Once | INTRAVENOUS | Status: AC | PRN
  Administered 2023-07-21: 20 mg via INTRAVENOUS

## 2023-07-21 MED ORDER — DIPHENHYDRAMINE HCL 25 MG PO CAPS
25.0000 mg | ORAL_CAPSULE | Freq: Once | ORAL | Status: AC
  Administered 2023-07-21: 25 mg via ORAL
  Filled 2023-07-21: qty 1

## 2023-07-21 MED ORDER — ALBUTEROL SULFATE HFA 108 (90 BASE) MCG/ACT IN AERS: 2.0000 | INHALATION_SPRAY | Freq: Once | RESPIRATORY_TRACT | Status: DC | PRN

## 2023-07-21 MED ORDER — SODIUM CHLORIDE 0.9 % IV SOLN
500.0000 mg | Freq: Once | INTRAVENOUS | Status: AC
  Administered 2023-07-21: 500 mg via INTRAVENOUS
  Filled 2023-07-21: qty 25

## 2023-07-21 MED ORDER — DIPHENHYDRAMINE HCL 50 MG/ML IJ SOLN
50.0000 mg | Freq: Once | INTRAMUSCULAR | Status: AC | PRN
  Administered 2023-07-21: 50 mg via INTRAVENOUS

## 2023-07-21 MED ORDER — EPINEPHRINE 0.3 MG/0.3ML IJ SOAJ: 0.3000 mg | Freq: Once | INTRAMUSCULAR | Status: DC | PRN

## 2023-07-21 MED ORDER — SODIUM CHLORIDE 0.9 % IV SOLN: Freq: Once | INTRAVENOUS | Status: AC | PRN

## 2023-07-21 MED ORDER — ACETAMINOPHEN 325 MG PO TABS
650.0000 mg | ORAL_TABLET | Freq: Once | ORAL | Status: AC
  Administered 2023-07-21: 650 mg via ORAL
  Filled 2023-07-21: qty 2

## 2023-07-21 MED ORDER — METHYLPREDNISOLONE SODIUM SUCC 125 MG IJ SOLR
125.0000 mg | Freq: Once | INTRAMUSCULAR | Status: AC | PRN
  Administered 2023-07-21: 125 mg via INTRAVENOUS

## 2023-07-21 NOTE — Progress Notes (Signed)
 Diagnosis: Iron Deficiency Anemia  Provider:  Chilton Greathouse MD  Procedure: IV Infusion  IV Type: Peripheral, IV Location: L Antecubital  Venofer (Iron Sucrose), Dose: 500 mg  Infusion Start Time: 0908  Infusion Stop Time: 1335  Post Infusion IV Care: Observation period completed and Peripheral IV Discontinued  Discharge: Condition: Good, Destination: Home . AVS Provided  Performed by:  Rico Ala, LPN    At  1324 , patient complained of symptoms including   swelling and rash  on left hand. Patient did  take pre-medications prior to infusion. 1413 rash on left leg with tingling on bilateral legs. Emergency protocols initiated and emergency medications administered including Benadryl 50 mg IVP at 1409, Solumedrol 125 mg IVP at 1410, NS 1000 mL bolus at 1412, and Pepcid 20 mg IVPB at 1417  . Vital signs stable. Ordering provider  Henreitta Leber , PA notified via phone call to provider's office at 1414 , and responded at  1414 . Additional orders were given. Family/signficant other was present at the infusion clinic with patient. Patient was instructed by provider to wait 30 more mins to make sure her symptoms did not get any worse. After the 30 mins was up pt stated that she felt fine but there was still a rash present on left forearm and left lower leg. No other symptoms were present at this time. Pt was instructed by nurse that if her symptoms start to get worse she needs to go to the ER for further evaluation. Patient verbalized understanding.

## 2023-07-22 ENCOUNTER — Encounter (HOSPITAL_COMMUNITY): Payer: Self-pay

## 2023-07-23 ENCOUNTER — Other Ambulatory Visit: Payer: Self-pay

## 2023-07-29 ENCOUNTER — Other Ambulatory Visit: Payer: Self-pay | Admitting: Obstetrics and Gynecology

## 2023-07-30 ENCOUNTER — Telehealth: Payer: Self-pay | Admitting: Pharmacy Technician

## 2023-07-30 NOTE — Telephone Encounter (Signed)
 Auth Submission: APPROVED Site of care: Site of care: [facility] Payer: [employer] Medication & CPT/J Code(s) submitted: Feraheme (ferumoxytol) [id number] Route of submission (phone, fax, portal):  Phone # Fax # Auth type: Buy/Bill PB Units/visits requested: 2 DOSES Reference number: [id number] Approval from: 07/30/23 to 01/30/24

## 2023-08-04 ENCOUNTER — Ambulatory Visit (INDEPENDENT_AMBULATORY_CARE_PROVIDER_SITE_OTHER): Payer: 59

## 2023-08-04 VITALS — BP 120/75 | HR 81 | Temp 98.4°F | Resp 18 | Ht 66.0 in | Wt 215.6 lb

## 2023-08-04 DIAGNOSIS — D509 Iron deficiency anemia, unspecified: Secondary | ICD-10-CM | POA: Diagnosis not present

## 2023-08-04 MED ORDER — ACETAMINOPHEN 325 MG PO TABS
650.0000 mg | ORAL_TABLET | Freq: Once | ORAL | Status: AC
Start: 2023-08-04 — End: 2023-08-04
  Administered 2023-08-04: 650 mg via ORAL
  Filled 2023-08-04: qty 2

## 2023-08-04 MED ORDER — DIPHENHYDRAMINE HCL 25 MG PO CAPS
25.0000 mg | ORAL_CAPSULE | Freq: Once | ORAL | Status: AC
  Administered 2023-08-04: 25 mg via ORAL
  Filled 2023-08-04: qty 1

## 2023-08-04 MED ORDER — SODIUM CHLORIDE 0.9 % IV SOLN
510.0000 mg | Freq: Once | INTRAVENOUS | Status: AC
  Administered 2023-08-04: 510 mg via INTRAVENOUS
  Filled 2023-08-04: qty 17

## 2023-08-04 MED ORDER — METHYLPREDNISOLONE SODIUM SUCC 40 MG IJ SOLR
40.0000 mg | Freq: Once | INTRAMUSCULAR | Status: AC
Start: 2023-08-04 — End: 2023-08-04
  Administered 2023-08-04: 40 mg via INTRAVENOUS
  Filled 2023-08-04: qty 1

## 2023-08-04 NOTE — Progress Notes (Signed)
 Diagnosis: Iron Deficiency Anemia  Provider:  Chilton Greathouse MD  Procedure: IV Infusion  IV Type: Peripheral, IV Location: L Antecubital  Feraheme (Ferumoxytol), Dose: 510 mg  Infusion Start Time: 0937  Infusion Stop Time: 1000  Post Infusion IV Care: Patient declined observation and Peripheral IV Discontinued  Discharge: Condition: Good, Destination: Home . AVS Declined  Performed by:  Rico Ala, LPN

## 2023-08-07 ENCOUNTER — Ambulatory Visit
Admission: RE | Admit: 2023-08-07 | Discharge: 2023-08-07 | Disposition: A | Discharge status: Home / Self Care | Source: Ambulatory Visit | Attending: Obstetrics and Gynecology | Admitting: Obstetrics and Gynecology

## 2023-08-07 DIAGNOSIS — R928 Other abnormal and inconclusive findings on diagnostic imaging of breast: Secondary | ICD-10-CM

## 2023-08-14 ENCOUNTER — Other Ambulatory Visit: Payer: Self-pay | Admitting: Obstetrics and Gynecology

## 2023-08-14 DIAGNOSIS — N6310 Unspecified lump in the right breast, unspecified quadrant: Secondary | ICD-10-CM

## 2024-05-17 ENCOUNTER — Other Ambulatory Visit: Payer: Self-pay | Admitting: Obstetrics and Gynecology

## 2024-05-17 DIAGNOSIS — Z1231 Encounter for screening mammogram for malignant neoplasm of breast: Secondary | ICD-10-CM

## 2024-06-23 ENCOUNTER — Other Ambulatory Visit: Payer: Self-pay | Admitting: Obstetrics and Gynecology

## 2024-06-23 DIAGNOSIS — N6310 Unspecified lump in the right breast, unspecified quadrant: Secondary | ICD-10-CM

## 2024-08-09 ENCOUNTER — Other Ambulatory Visit

## 2024-08-09 ENCOUNTER — Encounter
